# Patient Record
Sex: Female | Born: 1995 | Race: White | Hispanic: No | Marital: Married | Smoking: Never smoker
Health system: Southern US, Community
[De-identification: ages and names within clinical notes are randomized; demographics above are authoritative.]

## PROBLEM LIST (undated history)

## (undated) DIAGNOSIS — K219 Gastro-esophageal reflux disease without esophagitis: Secondary | ICD-10-CM

## (undated) DIAGNOSIS — G4700 Insomnia, unspecified: Secondary | ICD-10-CM

## (undated) DIAGNOSIS — E282 Polycystic ovarian syndrome: Secondary | ICD-10-CM

## (undated) DIAGNOSIS — F419 Anxiety disorder, unspecified: Secondary | ICD-10-CM

## (undated) DIAGNOSIS — N760 Acute vaginitis: Secondary | ICD-10-CM

## (undated) DIAGNOSIS — F32A Depression, unspecified: Secondary | ICD-10-CM

## (undated) DIAGNOSIS — B9689 Other specified bacterial agents as the cause of diseases classified elsewhere: Secondary | ICD-10-CM

## (undated) DIAGNOSIS — E785 Hyperlipidemia, unspecified: Secondary | ICD-10-CM

## (undated) DIAGNOSIS — R519 Headache, unspecified: Secondary | ICD-10-CM

## (undated) DIAGNOSIS — F329 Major depressive disorder, single episode, unspecified: Secondary | ICD-10-CM

## (undated) DIAGNOSIS — J302 Other seasonal allergic rhinitis: Secondary | ICD-10-CM

## (undated) DIAGNOSIS — L709 Acne, unspecified: Secondary | ICD-10-CM

## (undated) HISTORY — DX: Acute vaginitis: B96.89

## (undated) HISTORY — PX: CHOLECYSTECTOMY: SHX55

## (undated) HISTORY — DX: Insomnia, unspecified: G47.00

## (undated) HISTORY — DX: Acute vaginitis: N76.0

## (undated) HISTORY — DX: Major depressive disorder, single episode, unspecified: F32.9

## (undated) HISTORY — DX: Acne, unspecified: L70.9

## (undated) HISTORY — DX: Polycystic ovarian syndrome: E28.2

## (undated) HISTORY — DX: Other seasonal allergic rhinitis: J30.2

## (undated) HISTORY — DX: Depression, unspecified: F32.A

---
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
## 2013-06-05 ENCOUNTER — Emergency Department (HOSPITAL_COMMUNITY)
Admission: EM | Admit: 2013-06-05 | Discharge: 2013-06-05 | Disposition: A | Discharge status: Home / Self Care | Payer: No Typology Code available for payment source | Attending: Emergency Medicine | Admitting: Emergency Medicine

## 2013-06-05 ENCOUNTER — Emergency Department (HOSPITAL_COMMUNITY): Payer: No Typology Code available for payment source

## 2013-06-05 ENCOUNTER — Encounter (HOSPITAL_COMMUNITY): Payer: Self-pay | Admitting: Emergency Medicine

## 2013-06-05 DIAGNOSIS — Y92410 Unspecified street and highway as the place of occurrence of the external cause: Secondary | ICD-10-CM | POA: Insufficient documentation

## 2013-06-05 DIAGNOSIS — S39012A Strain of muscle, fascia and tendon of lower back, initial encounter: Secondary | ICD-10-CM

## 2013-06-05 DIAGNOSIS — Y9389 Activity, other specified: Secondary | ICD-10-CM | POA: Insufficient documentation

## 2013-06-05 DIAGNOSIS — S239XXA Sprain of unspecified parts of thorax, initial encounter: Secondary | ICD-10-CM | POA: Insufficient documentation

## 2013-06-05 DIAGNOSIS — Z8781 Personal history of (healed) traumatic fracture: Secondary | ICD-10-CM | POA: Insufficient documentation

## 2013-06-05 MED ORDER — NAPROXEN 500 MG PO TABS: 500.0000 mg | ORAL_TABLET | Freq: Two times a day (BID) | ORAL | Status: DC

## 2013-06-05 MED ORDER — METHOCARBAMOL 500 MG PO TABS: 500.0000 mg | ORAL_TABLET | Freq: Three times a day (TID) | ORAL | Status: DC | PRN
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
## 2013-06-05 NOTE — Discharge Instructions (Signed)

## 2015-11-14 ENCOUNTER — Ambulatory Visit: Payer: Self-pay | Admitting: Family Medicine

## 2015-11-21 ENCOUNTER — Ambulatory Visit (INDEPENDENT_AMBULATORY_CARE_PROVIDER_SITE_OTHER): Payer: BLUE CROSS/BLUE SHIELD | Admitting: Family Medicine

## 2015-11-21 ENCOUNTER — Encounter: Payer: Self-pay | Admitting: Family Medicine

## 2015-11-21 DIAGNOSIS — Z0000 Encounter for general adult medical examination without abnormal findings: Secondary | ICD-10-CM | POA: Diagnosis not present

## 2015-11-21 NOTE — Patient Instructions (Signed)
Health Maintenance, Female Adopting a healthy lifestyle and getting preventive care can go a long way to promote health and wellness. Talk with your health care provider about what schedule of regular examinations is right for you. This is a good chance for you to check in with your provider about disease prevention and staying healthy. In between checkups, there are plenty of things you can do on your own. Experts have done a lot of research about which lifestyle changes and preventive measures are most likely to keep you healthy. Ask your health care provider for more information. WEIGHT AND DIET  Eat a healthy diet  Be sure to include plenty of vegetables, fruits, low-fat dairy products, and lean protein.  Do not eat a lot of foods high in solid fats, added sugars, or salt.  Get regular exercise. This is one of the most important things you can do for your health.  Most adults should exercise for at least 150 minutes each week. The exercise should increase your heart rate and make you sweat (moderate-intensity exercise).  Most adults should also do strengthening exercises at least twice a week. This is in addition to the moderate-intensity exercise.  Maintain a healthy weight  Body mass index (BMI) is a measurement that can be used to identify possible weight problems. It estimates body fat based on height and weight. Your health care provider can help determine your BMI and help you achieve or maintain a healthy weight.  For females 20 years of age and older:   A BMI below 18.5 is considered underweight.  A BMI of 18.5 to 24.9 is normal.  A BMI of 25 to 29.9 is considered overweight.  A BMI of 30 and above is considered obese.  Watch levels of cholesterol and blood lipids  You should start having your blood tested for lipids and cholesterol at 22 years of age, then have this test every 5 years.  You may need to have your cholesterol levels checked more often if:  Your lipid  or cholesterol levels are high.  You are older than 22 years of age.  You are at high risk for heart disease.  CANCER SCREENING   Lung Cancer  Lung cancer screening is recommended for adults 55-80 years old who are at high risk for lung cancer because of a history of smoking.  A yearly low-dose CT scan of the lungs is recommended for people who:  Currently smoke.  Have quit within the past 15 years.  Have at least a 30-pack-year history of smoking. A pack year is smoking an average of one pack of cigarettes a day for 1 year.  Yearly screening should continue until it has been 15 years since you quit.  Yearly screening should stop if you develop a health problem that would prevent you from having lung cancer treatment.  Breast Cancer  Practice breast self-awareness. This means understanding how your breasts normally appear and feel.  It also means doing regular breast self-exams. Let your health care provider know about any changes, no matter how small.  If you are in your 20s or 30s, you should have a clinical breast exam (CBE) by a health care provider every 1-3 years as part of a regular health exam.  If you are 40 or older, have a CBE every year. Also consider having a breast X-ray (mammogram) every year.  If you have a family history of breast cancer, talk to your health care provider about genetic screening.  If you   are at high risk for breast cancer, talk to your health care provider about having an MRI and a mammogram every year.  Breast cancer gene (BRCA) assessment is recommended for women who have family members with BRCA-related cancers. BRCA-related cancers include:  Breast.  Ovarian.  Tubal.  Peritoneal cancers.  Results of the assessment will determine the need for genetic counseling and BRCA1 and BRCA2 testing. Cervical Cancer Your health care provider may recommend that you be screened regularly for cancer of the pelvic organs (ovaries, uterus, and  vagina). This screening involves a pelvic examination, including checking for microscopic changes to the surface of your cervix (Pap test). You may be encouraged to have this screening done every 3 years, beginning at age 21.  For women ages 30-65, health care providers may recommend pelvic exams and Pap testing every 3 years, or they may recommend the Pap and pelvic exam, combined with testing for human papilloma virus (HPV), every 5 years. Some types of HPV increase your risk of cervical cancer. Testing for HPV may also be done on women of any age with unclear Pap test results.  Other health care providers may not recommend any screening for nonpregnant women who are considered low risk for pelvic cancer and who do not have symptoms. Ask your health care provider if a screening pelvic exam is right for you.  If you have had past treatment for cervical cancer or a condition that could lead to cancer, you need Pap tests and screening for cancer for at least 20 years after your treatment. If Pap tests have been discontinued, your risk factors (such as having a new sexual partner) need to be reassessed to determine if screening should resume. Some women have medical problems that increase the chance of getting cervical cancer. In these cases, your health care provider may recommend more frequent screening and Pap tests. Colorectal Cancer  This type of cancer can be detected and often prevented.  Routine colorectal cancer screening usually begins at 22 years of age and continues through 22 years of age.  Your health care provider may recommend screening at an earlier age if you have risk factors for colon cancer.  Your health care provider may also recommend using home test kits to check for hidden blood in the stool.  A small camera at the end of a tube can be used to examine your colon directly (sigmoidoscopy or colonoscopy). This is done to check for the earliest forms of colorectal  cancer.  Routine screening usually begins at age 50.  Direct examination of the colon should be repeated every 5-10 years through 22 years of age. However, you may need to be screened more often if early forms of precancerous polyps or small growths are found. Skin Cancer  Check your skin from head to toe regularly.  Tell your health care provider about any new moles or changes in moles, especially if there is a change in a mole's shape or color.  Also tell your health care provider if you have a mole that is larger than the size of a pencil eraser.  Always use sunscreen. Apply sunscreen liberally and repeatedly throughout the day.  Protect yourself by wearing long sleeves, pants, a wide-brimmed hat, and sunglasses whenever you are outside. HEART DISEASE, DIABETES, AND HIGH BLOOD PRESSURE   High blood pressure causes heart disease and increases the risk of stroke. High blood pressure is more likely to develop in:  People who have blood pressure in the high end   of the normal range (130-139/85-89 mm Hg).  People who are overweight or obese.  People who are African American.  If you are 38-23 years of age, have your blood pressure checked every 3-5 years. If you are 61 years of age or older, have your blood pressure checked every year. You should have your blood pressure measured twice--once when you are at a hospital or clinic, and once when you are not at a hospital or clinic. Record the average of the two measurements. To check your blood pressure when you are not at a hospital or clinic, you can use:  An automated blood pressure machine at a pharmacy.  A home blood pressure monitor.  If you are between 45 years and 39 years old, ask your health care provider if you should take aspirin to prevent strokes.  Have regular diabetes screenings. This involves taking a blood sample to check your fasting blood sugar level.  If you are at a normal weight and have a low risk for diabetes,  have this test once every three years after 22 years of age.  If you are overweight and have a high risk for diabetes, consider being tested at a younger age or more often. PREVENTING INFECTION  Hepatitis B  If you have a higher risk for hepatitis B, you should be screened for this virus. You are considered at high risk for hepatitis B if:  You were born in a country where hepatitis B is common. Ask your health care provider which countries are considered high risk.  Your parents were born in a high-risk country, and you have not been immunized against hepatitis B (hepatitis B vaccine).  You have HIV or AIDS.  You use needles to inject street drugs.  You live with someone who has hepatitis B.  You have had sex with someone who has hepatitis B.  You get hemodialysis treatment.  You take certain medicines for conditions, including cancer, organ transplantation, and autoimmune conditions. Hepatitis C  Blood testing is recommended for:  Everyone born from [birth year] through 1967.  Anyone with known risk factors for hepatitis C. Sexually transmitted infections (STIs)  You should be screened for sexually transmitted infections (STIs) including gonorrhea and chlamydia if:  You are sexually active and are younger than 22 years of age.  You are older than 22 years of age and your health care provider tells you that you are at risk for this type of infection.  Your sexual activity has changed since you were last screened and you are at an increased risk for chlamydia or gonorrhea. Ask your health care provider if you are at risk.  If you do not have HIV, but are at risk, it may be recommended that you take a prescription medicine daily to prevent HIV infection. This is called pre-exposure prophylaxis (PrEP). You are considered at risk if:  You are sexually active and do not regularly use condoms or know the HIV status of your partner(s).  You take drugs by injection.  You are sexually  active with a partner who has HIV. Talk with your health care provider about whether you are at high risk of being infected with HIV. If you choose to begin PrEP, you should first be tested for HIV. You should then be tested every 3 months for as long as you are taking PrEP.  PREGNANCY   If you are premenopausal and you may become pregnant, ask your health care provider about preconception counseling.  If you may  become pregnant, take 400 to 800 micrograms (mcg) of folic acid every day.  If you want to prevent pregnancy, talk to your health care provider about birth control (contraception). OSTEOPOROSIS AND MENOPAUSE   Osteoporosis is a disease in which the bones lose minerals and strength with aging. This can result in serious bone fractures. Your risk for osteoporosis can be identified using a bone density scan.  If you are 61 years of age or older, or if you are at risk for osteoporosis and fractures, ask your health care provider if you should be screened.  Ask your health care provider whether you should take a calcium or vitamin D supplement to lower your risk for osteoporosis.  Menopause may have certain physical symptoms and risks.  Hormone replacement therapy may reduce some of these symptoms and risks. Talk to your health care provider about whether hormone replacement therapy is right for you.  HOME CARE INSTRUCTIONS   Schedule regular health, dental, and eye exams.  Stay current with your immunizations.   Do not use any tobacco products including cigarettes, chewing tobacco, or electronic cigarettes.  If you are pregnant, do not drink alcohol.  If you are breastfeeding, limit how much and how often you drink alcohol.  Limit alcohol intake to no more than 1 drink per day for nonpregnant women. One drink equals 12 ounces of beer, 5 ounces of wine, or 1 ounces of hard liquor.  Do not use street drugs.  Do not share needles.  Ask your health care provider for help if  you need support or information about quitting drugs.  Tell your health care provider if you often feel depressed.  Tell your health care provider if you have ever been abused or do not feel safe at home.   This information is not intended to replace advice given to you by your health care provider. Make sure you discuss any questions you have with your health care provider.   Document Released: 10/07/2010 Document Revised: 04/14/2014 Document Reviewed: 02/23/2013 Elsevier Interactive Patient Education [employer].

## 2015-11-21 NOTE — Assessment & Plan Note (Signed)
Pap smear up-to-date. Tetanus up-to-date. Flu shot later this year. Declines labs. Using NuvaRing for contraception. 1 partner.
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
## 2015-11-21 NOTE — Progress Notes (Signed)
Pre visit review using our clinic review tool, if applicable. No additional management support is needed unless otherwise documented below in the visit note. 

## 2015-12-18 ENCOUNTER — Ambulatory Visit (INDEPENDENT_AMBULATORY_CARE_PROVIDER_SITE_OTHER): Payer: BC Managed Care – PPO | Admitting: Family Medicine

## 2015-12-18 DIAGNOSIS — M546 Pain in thoracic spine: Secondary | ICD-10-CM | POA: Diagnosis not present

## 2015-12-18 NOTE — Progress Notes (Signed)
   Subjective:  Patient ID: Olivia Gillespie, female    DOB: 23-Nov-1995  Age: 20 y.o. MRN: [id number]  CC: Back pain  HPI:  20 year old female presents with the above complaint.  Patient states that she has been experiencing upper back pain for the past few days (since Thursday). Patient states that the pain is located in the middle of her upper back between the scapula. She feels like she may have injured the area while at work. She's been using topical BenGay with improvement. No known exacerbating factors. No other associated symptoms. No other complaints at this time.  Social Hx   Social History   Social History  . Marital status: Single    Spouse name: N/A  . Number of children: N/A  . Years of education: N/A   Social History Main Topics  . Smoking status: Never Smoker  . Smokeless tobacco: Never Used  . Alcohol use No  . Drug use: No  . Sexual activity: Yes   Other Topics Concern  . Not on file   Social History Narrative  . No narrative on file    Review of Systems  Constitutional: Negative.   Musculoskeletal: Positive for back pain.   Objective:  BP 120/84 (BP Location: Left Arm)   Pulse 99   Temp 98.7 F (37.1 C) (Oral)   Wt 159 lb 6.4 oz (72.3 kg)   SpO2 99%   BMI 28.24 kg/m   BP/Weight 12/18/2015 11/21/2015 06/05/2013  Systolic BP 120 119 134  Diastolic BP 84 83 84  Wt. (Lbs) 159.4 153.5 -  BMI 28.24 27.19 -   Physical Exam  Constitutional: She is oriented to person, place, and time. She appears well-developed. No distress.  Pulmonary/Chest: Effort normal.  Musculoskeletal:  Upper back - nontender to palpation. No evidence of spasm.  Neurological: She is alert and oriented to person, place, and time.  Psychiatric: She has a normal mood and affect.  Vitals reviewed.   Assessment & Plan:   Problem List Items Addressed This Visit    Thoracic back pain    New problem.  MSK in etiology. No indication for imaging. Advised Ibuprofen 800 mg TID PRN. OTC  Biofreeze or Bengay if desired.       Other Visit Diagnoses   None.     Follow-up: PRN  Everlene OtherJayce Nikolai Wilczak DO [facility] [place]

## 2015-12-18 NOTE — Patient Instructions (Signed)
This appears to be muscular in nature.  Bengay and Biofreeze is fine.  Use Ibuprofen 800 mg three times daily as needed.  Follow up annually. Sooner if you worsen.  Take care  Dr. Adriana Simasook

## 2015-12-18 NOTE — Assessment & Plan Note (Signed)
New problem.  MSK in etiology. No indication for imaging. Advised Ibuprofen 800 mg TID PRN. OTC Biofreeze or Bengay if desired.

## 2016-03-27 ENCOUNTER — Ambulatory Visit (INDEPENDENT_AMBULATORY_CARE_PROVIDER_SITE_OTHER): Payer: BC Managed Care – PPO | Admitting: Certified Nurse Midwife

## 2016-03-27 ENCOUNTER — Encounter: Payer: Self-pay | Admitting: Certified Nurse Midwife

## 2016-03-27 VITALS — BP 124/82 | HR 89 | Ht 66.0 in | Wt 157.4 lb

## 2016-03-27 DIAGNOSIS — N760 Acute vaginitis: Secondary | ICD-10-CM | POA: Diagnosis not present

## 2016-03-27 DIAGNOSIS — N898 Other specified noninflammatory disorders of vagina: Secondary | ICD-10-CM | POA: Diagnosis not present

## 2016-03-27 NOTE — Progress Notes (Signed)
CHIEF COMPLAINT/HPI:  20 y.o. female complains of clear, odorless, scant vaginal discharge with vulvar erythema for two week(s). Pt has history of frequent BV and has successfully used Metrogel in the past to treat symptoms. Pt used refill of Metrogel last week and is feeling better, but decided to keep today's appointment since it was already scheduled.   Denies abnormal vaginal bleeding, significant pelvic pain or fever. Sexually active, use condoms, no change in partner.  Last intercourse 2 days ago.  Denies history of known exposure to STD or symptoms in partner.  Patient's last menstrual period was 03/09/2016.  No history of STD's.  Review of Systems  Constitutional: Negative for fever and chills Eyes: Negative for visual disturbances Respiratory: Negative for shortness of breath, dyspnea Cardiovascular: Negative for chest pain or palpitations  Gastrointestinal: Negative for vomiting, diarrhea and constipation Genitourinary: burning with urination Musculoskeletal: Negative for back pain, joint pain, myalgias  Neurological: Negative for dizziness and headaches    Past Medical History: Past Medical History:  Diagnosis Date  . Bacterial vaginitis   . Depression     Past Surgical History: Past Surgical History:  Procedure Laterality Date  . WISDOM TOOTH EXTRACTION  2013    Obstetrical History: OB History    Gravida Para Term Preterm AB Living   0 0 0 0 0 0   SAB TAB Ectopic Multiple Live Births   0 0 0 0 0       Social History: Social History   Social History  . Marital status: Single    Spouse name: N/A  . Number of children: N/A  . Years of education: N/A   Social History Main Topics  . Smoking status: Never Smoker  . Smokeless tobacco: Never Used  . Alcohol use No  . Drug use: No  . Sexual activity: Yes    Birth control/ protection: Inserts   Other Topics Concern  . None   Social History Narrative  . None    Family History: Family History  Problem  Relation Age of Onset  . Hypertension Mother   . Mental illness Maternal Grandmother   . Hyperlipidemia Paternal Grandfather   . Diabetes Paternal Grandfather     Allergies: No Known Allergies   Objective:   BP 124/82   Pulse 89   Ht 5\' 6"  (1.676 m)   Wt 157 lb 6.4 oz (71.4 kg)   LMP 03/09/2016   BMI 25.41 kg/m  CONSTITUTIONAL: Well-developed, female in no acute distress.  HENT:  Normocephalic, atraumatic.  NECK: Not Examined SKIN: Skin is warm and dry. No rash noted. Not diaphoretic. No erythema. No pallor. NEUROLGIC: Alert and oriented to person, place, and time.  PSYCHIATRIC: Normal mood and affect. Normal behavior. Normal judgment and thought content. CARDIOVASCULAR:Not Examined RESPIRATORY: Not Examined BREASTS: Not Examined ABDOMEN: Not Examined PELVIC:  External Genitalia: Normal  BUS: Normal  Vagina: Normal  Cervix: Normal, no cervical motion tenderness MUSCULOSKELETAL: Normal range of motion. No tenderness.  No cyanosis, clubbing, or edema.  Urine dip: Negative Wet prep: Negative   Assessment:   Vaginal discharge Vaginal irriration   Plan:   Continue to use Metrogel as needed.  GC/Ch from urine per patient request.  RTC in May for AE or sooner if needed.   Gunnar BullaJenkins Michelle Emila Steinhauser, CNM

## 2016-03-29 LAB — GC/CHLAMYDIA PROBE AMP
Chlamydia trachomatis, NAA: NEGATIVE
Neisseria gonorrhoeae by PCR: NEGATIVE

## 2016-04-28 ENCOUNTER — Encounter: Payer: Self-pay | Admitting: *Deleted

## 2016-05-06 ENCOUNTER — Ambulatory Visit (INDEPENDENT_AMBULATORY_CARE_PROVIDER_SITE_OTHER): Payer: BC Managed Care – PPO | Admitting: Family Medicine

## 2016-05-06 DIAGNOSIS — B349 Viral infection, unspecified: Secondary | ICD-10-CM

## 2016-05-06 NOTE — Progress Notes (Signed)
    Subjective:  Patient ID: Olivia Gillespie, female    DOB: [birth year]  Age: 21 y.o. MRN: [id number]  CC: Concern for influenza  HPI:  21 year old female presents with concerns for influenza.  Patient states that last night she developed nausea, chest tightness/trouble getting a deep breath, fatigue, dizziness, congestion, runny nose. No fever. No chills. No body aches. She has had exposure to an individual with the flu at work. This is her primary concern today. No medications or interventions tried. No known exacerbating or relieving factors. No other complaints or concerns at this time.  Social Hx   Social History   Social History  . Marital status: Single    Spouse name: N/A  . Number of children: N/A  . Years of education: N/A   Social History Main Topics  . Smoking status: Never Smoker  . Smokeless tobacco: Never Used  . Alcohol use No  . Drug use: No  . Sexual activity: Yes    Birth control/ protection: Inserts   Other Topics Concern  . Not on file   Social History Narrative  . No narrative on file   Review of Systems  Constitutional: Positive for fatigue. Negative for chills and fever.  HENT: Positive for congestion and rhinorrhea.   Respiratory: Positive for chest tightness.   Gastrointestinal: Positive for nausea.  Musculoskeletal:       No body aches.  Neurological: Positive for dizziness.   Objective:  BP 119/85   Temp 98.4 F (36.9 C) (Oral)   Wt 154 lb 9.6 oz (70.1 kg)   SpO2 98%   BMI 24.95 kg/m   BP/Weight 05/06/2016 03/27/2016 12/18/2015  Systolic BP 119 124 120  Diastolic BP 85 82 84  Wt. (Lbs) 154.6 157.4 159.4  BMI 24.95 25.41 28.24    Physical Exam  Constitutional: She is oriented to person, place, and time. She appears well-developed. No distress.  HENT:  Head: Normocephalic and atraumatic.  Mouth/Throat: Oropharynx is clear and moist.  Normal TMs bilaterally.  Cardiovascular: Regular rhythm.   Tachycardia.  Pulmonary/Chest: Effort  normal and breath sounds normal. She has no wheezes. She has no rales.  Abdominal: Soft. She exhibits no distension. There is no tenderness. There is no rebound and no guarding.  Neurological: She is alert and oriented to person, place, and time.  Vitals reviewed.   Assessment & Plan:   Problem List Items Addressed This Visit    Viral illness    New acute problem. Acute uncomplicated illness. Based on history of physical exam, does not appear to be the flu. She was not swab for influenza as we are running out of tests and they are on back order (given the current flu crisis). She has had flu exposure. I doubt that she has the flu given her clinical picture and lack of fever, chills, body aches. Advised supportive care and over-the-counter medication as needed for her symptoms.        Follow-up: PRN  Everlene OtherJayce Tashe Purdon DO [facility] [place]

## 2016-05-06 NOTE — Assessment & Plan Note (Signed)
New acute problem. Acute uncomplicated illness. Based on history of physical exam, does not appear to be the flu. She was not swab for influenza as we are running out of tests and they are on back order (given the current flu crisis). She has had flu exposure. I doubt that she has the flu given her clinical picture and lack of fever, chills, body aches. Advised supportive care and over-the-counter medication as needed for her symptoms.

## 2016-05-06 NOTE — Patient Instructions (Signed)
This appears viral (I doubt the flu).  If you worsen, please call.  Use OTC medication for your symptoms if you desire.  Take care  Dr. Adriana Simasook

## 2016-05-07 ENCOUNTER — Other Ambulatory Visit: Payer: Self-pay | Admitting: Family Medicine

## 2016-05-07 ENCOUNTER — Telehealth: Payer: Self-pay | Admitting: Family Medicine

## 2016-05-07 MED ORDER — OMEPRAZOLE 40 MG PO CPDR: 40.0000 mg | DELAYED_RELEASE_CAPSULE | Freq: Every day | ORAL | 0 refills | Status: DC

## 2016-05-07 NOTE — Telephone Encounter (Signed)
Will send in Rx.

## 2016-05-07 NOTE — Telephone Encounter (Signed)
Patient notified and voiced understanding.

## 2016-05-07 NOTE — Telephone Encounter (Signed)
Pt called and stated that she came in yesterday and some of her symptoms are getting worse. She states that she is having chest tightness that feels like heartburn, fatigue,dizziness, and bloating. Please advise, thank you!  Call pt @ [phone]

## 2016-05-07 NOTE — Telephone Encounter (Signed)
Patient saw PCP on 05/06/16 for viral infection, patient called today with worsening symptoms of fatigue, chest tightness, dizziness, and heartburn with bloating.Patient stated that she cannot sleep due the chest tightness, with heartburn, and a lot of gas has tried elevation, patient is taking Tums for the heartburn which helps for about 2 hours. Patient is still a febrile. Patient really wants to know if there is something she can take that is stronger than Tums.

## 2016-05-09 NOTE — Telephone Encounter (Signed)
Pt called back and states that the medication that Dr. Adriana Simasook has prescribed has give her really bad headaches. Pt is not sure if this is a side effect of the medication. Please advise, thank you!  Call pt @ [phone]

## 2016-05-09 NOTE — Telephone Encounter (Signed)
Ibuprofen as needed. 800 mg TID PRN.

## 2016-05-09 NOTE — Telephone Encounter (Signed)
Reason for call:left frontal headache  Symptoms:headache pain all the way to back of head started Wednesday night  Medications:omeprazole, Tylenol didn't relieve pain Last seen for this problem:05/06/2016 Omeprazole helped stomach ulcer  Please advise

## 2016-05-09 NOTE — Telephone Encounter (Signed)
Patient advised of below and verbalized understanding.  

## 2016-06-11 ENCOUNTER — Telehealth: Payer: Self-pay | Admitting: Certified Nurse Midwife

## 2016-06-11 DIAGNOSIS — Z789 Other specified health status: Secondary | ICD-10-CM

## 2016-06-11 MED ORDER — ULIPRISTAL ACETATE 30 MG PO TABS: 1.0000 | ORAL_TABLET | Freq: Once | ORAL | 0 refills | Status: AC

## 2016-06-11 NOTE — Telephone Encounter (Signed)
Called pt she states that she would like RX for "Samson Fredericlla" a form of emergency contraception that her insurance will cover. Advised pt that RX would be sent in if provider approved, advised pt that this is not a form of contraception and needs to be compliant with Nuvaring. RX sent in after speaking with M. Jeralyn BennettLawhorn.

## 2016-06-11 NOTE — Telephone Encounter (Signed)
Pt called asking if we could send something to the pharmacy for emergency contraceptive.  [facility]  Thank sTeri

## 2016-07-01 ENCOUNTER — Telehealth: Payer: Self-pay | Admitting: Family Medicine

## 2016-07-01 NOTE — Telephone Encounter (Signed)
Pt states that her mom was diagnosed with hormonal issues and wants to make sure she does not have it or if she does to have the endocrinologist check it.

## 2016-07-01 NOTE — Telephone Encounter (Signed)
What is she referring to. I need more info.

## 2016-07-01 NOTE — Telephone Encounter (Signed)
Left message to call.

## 2016-07-01 NOTE — Telephone Encounter (Signed)
Pt lvm asking for a referral to endocrinology. States that her mother has a hormonal disorder and she thinks she does as well. Please advise.

## 2016-07-02 NOTE — Telephone Encounter (Signed)
Appointment scheduled.

## 2016-07-02 NOTE — Telephone Encounter (Signed)
She should not need a referral to GYN as this is primary care.

## 2016-07-02 NOTE — Telephone Encounter (Signed)
Patient calls back stating mom was diagnosed with Polycystic ovarian syndrome would like referral to OB/GYN.

## 2016-07-10 ENCOUNTER — Encounter: Payer: Self-pay | Admitting: Certified Nurse Midwife

## 2016-07-10 ENCOUNTER — Ambulatory Visit (INDEPENDENT_AMBULATORY_CARE_PROVIDER_SITE_OTHER): Payer: BC Managed Care – PPO | Admitting: Certified Nurse Midwife

## 2016-07-10 VITALS — BP 116/88 | HR 96 | Ht 65.0 in | Wt 155.8 lb

## 2016-07-10 DIAGNOSIS — L680 Hirsutism: Secondary | ICD-10-CM

## 2016-07-10 DIAGNOSIS — L709 Acne, unspecified: Secondary | ICD-10-CM

## 2016-07-10 DIAGNOSIS — Z842 Family history of other diseases of the genitourinary system: Secondary | ICD-10-CM

## 2016-07-10 DIAGNOSIS — Z3044 Encounter for surveillance of vaginal ring hormonal contraceptive device: Secondary | ICD-10-CM | POA: Diagnosis not present

## 2016-07-10 MED ORDER — ETONOGESTREL-ETHINYL ESTRADIOL 0.12-0.015 MG/24HR VA RING: VAGINAL_RING | VAGINAL | 12 refills | Status: DC

## 2016-07-10 NOTE — Progress Notes (Signed)
GYN ENCOUNTER NOTE  Subjective:       Olivia Gillespie is a 21 y.o. G0P0000 female here for birth control refill.   Olivia Gillespie currently uses the NuvaRing and likes it.   Her mother was diagnosed with PCOS and recently Olivia Gillespie started to notice similar symptoms in herself.  She reports thinning hair, acne to face and body, and excessive body hair that requires weekly home waxing.   Last year, she discussed her mother's diagnoses with her previous provider and requested an official "work up for PCOS", but that request was denied.   Olivia Gillespie requests labs and possibly an ultrasound "rule out or in" PCOS.   Denies difficulty breathing or respiratory distress, chest pain, abdominal pain, unexplained vaginal bleeding, and leg pain or swelling.    Gynecologic History  Patient's last menstrual period was 04/20/2016.   Contraception: NuvaRing vaginal inserts   Last Pap: 2016. Results were: normal  Obstetric History OB History  Gravida Para Term Preterm AB Living  0 0 0 0 0 0  SAB TAB Ectopic Multiple Live Births  0 0 0 0 0        Past Medical History:  Diagnosis Date  . Bacterial vaginitis   . Depression     Past Surgical History:  Procedure Laterality Date  . WISDOM TOOTH EXTRACTION  2013    Current Outpatient Prescriptions on File Prior to Visit  Medication Sig Dispense Refill  . Melatonin 3 MG CAPS Take 3 mg by mouth at bedtime.    Marland Kitchen omeprazole (PRILOSEC) 40 MG capsule Take 1 capsule (40 mg total) by mouth daily. 30 capsule 0   No current facility-administered medications on file prior to visit.     No Known Allergies  Social History   Social History  . Marital status: Single    Spouse name: N/A  . Number of children: N/A  . Years of education: N/A   Occupational History  . Not on file.   Social History Main Topics  . Smoking status: Never Smoker  . Smokeless tobacco: Never Used  . Alcohol use No  . Drug use: No  . Sexual activity: Yes    Birth control/ protection:  Inserts   Other Topics Concern  . Not on file   Social History Narrative  . No narrative on file    Family History  Problem Relation Age of Onset  . Hypertension Mother   . Mental illness Maternal Grandmother   . Hyperlipidemia Paternal Grandfather   . Diabetes Paternal Grandfather     The following portions of the patient's history were reviewed and updated as appropriate: allergies, current medications, past family history, past medical history, past social history, past surgical history and problem list.  Review of Systems  Review of Systems - Negative except as noted above History obtained from the patient  Objective:   BP 116/88   Pulse 96   Ht  (1.651 m)   Wt 155 lb 12.8 oz (70.7 kg)   LMP 04/20/2016 Comment: nuvaring (may skip periods)  BMI 25.93 kg/m   Alert and oriented x 4, no apparent distress  Physical exam: Not indicated.   Assessment:   1. Hirsutism  2. Acne, unspecified acne type  3. Family history of polycystic ovarian syndrome  4. Encounter for surveillance of vaginal ring hormonal contraceptive device  Plan:   NuvaRing Rx, see orders. Risks and benefits reviewed including ACHES acroymn  RTC x 1 week for labs and ultrasound, see orders  RTC x  2 weeks to discuss results   Gunnar Bulla, CNM

## 2016-07-10 NOTE — Patient Instructions (Signed)
Diet for Polycystic Ovarian Syndrome Polycystic ovary syndrome (PCOS) is a disorder of the chemical messengers (hormones) that regulate menstruation. The condition causes important hormones to be out of balance. PCOS can:  Make your periods irregular or stop.  Cause cysts to develop on the ovaries.  Make it difficult to get pregnant.  Stop your body from responding to the effects of insulin (insulin resistance), which can lead to obesity and diabetes. Changing what you eat can help manage PCOS and improve your health. It can help you lose weight and improve the way your body uses insulin. What is my plan?  Eat breakfast, lunch, and dinner plus two snacks every day.  Include protein in each meal and snack.  Choose whole grains instead of products made with refined flour.  Eat a variety of foods.  Exercise regularly as told by your health care provider. What do I need to know about this eating plan? If you are overweight or obese, pay attention to how many calories you eat. Cutting down on calories can help you lose weight. Work with your health care provider or dietitian to figure out how many calories you need each day. What foods can I eat? Grains  Whole grains, such as whole wheat. Whole-grain breads, crackers, cereals, and pasta. Unsweetened oatmeal, bulgur, barley, quinoa, or brown rice. Corn or whole-wheat flour tortillas. Vegetables   Lettuce. Spinach. Peas. Beets. Cauliflower. Cabbage. Broccoli. Carrots. Tomatoes. Squash. Eggplant. Herbs. Peppers. Onions. Cucumbers. Brussels sprouts. Fruits  Berries. Bananas. Apples. Oranges. Grapes. Papaya. Mango. Pomegranate. Kiwi. Grapefruit. Cherries. Meats and Other Protein Sources  Lean proteins, such as fish, chicken, beans, eggs, and tofu. Dairy  Low-fat dairy products, such as skim milk, cheese sticks, and yogurt. Beverages  Low-fat or fat-free drinks, such as water, low-fat milk, sugar-free drinks, and 100% fruit  juice. Condiments  Ketchup. Mustard. Barbecue sauce. Relish. Low-fat or fat-free mayonnaise. Fats and Oils  Olive oil or canola oil. Walnuts and almonds. The items listed above may not be a complete list of recommended foods or beverages. Contact your dietitian for more options.  What foods are not recommended? Foods high in calories or fat. Fried foods. Sweets. Products made from refined white flour, including white bread, pastries, white rice, and pasta. The items listed above may not be a complete list of foods and beverages to avoid. Contact your dietitian for more information.  This information is not intended to replace advice given to you by your health care provider. Make sure you discuss any questions you have with your health care provider. Document Released: 07/16/2015 Document Revised: 08/30/2015 Document Reviewed: 04/05/2014 Elsevier Interactive Patient Education  2017 [employer].

## 2016-07-11 ENCOUNTER — Other Ambulatory Visit: Payer: BC Managed Care – PPO

## 2016-07-11 ENCOUNTER — Ambulatory Visit: Payer: BC Managed Care – PPO | Admitting: Family Medicine

## 2016-07-11 DIAGNOSIS — Z842 Family history of other diseases of the genitourinary system: Secondary | ICD-10-CM

## 2016-07-11 DIAGNOSIS — L709 Acne, unspecified: Secondary | ICD-10-CM

## 2016-07-11 DIAGNOSIS — L680 Hirsutism: Secondary | ICD-10-CM

## 2016-07-13 LAB — PROGESTERONE: Progesterone: 0.1 ng/mL

## 2016-07-13 LAB — LIPID PANEL
Chol/HDL Ratio: 3.8 ratio (ref 0.0–4.4)
Cholesterol, Total: 234 mg/dL — ABNORMAL HIGH (ref 100–199)
HDL: 61 mg/dL (ref >39)
LDL Calculated: 146 mg/dL — ABNORMAL HIGH (ref 0–99)
Triglycerides: 136 mg/dL (ref 0–149)
VLDL Cholesterol Cal: 27 mg/dL (ref 5–40)

## 2016-07-13 LAB — TESTOSTERONE, FREE, TOTAL, SHBG
Sex Hormone Binding: 206.1 nmol/L — ABNORMAL HIGH (ref 24.6–122.0)
TESTOSTERONE FREE: 0.3 pg/mL (ref 0.0–4.2)

## 2016-07-13 LAB — COMPREHENSIVE METABOLIC PANEL
ALK PHOS: 45 IU/L (ref 39–117)
ALT: 14 IU/L (ref 0–32)
AST: 14 IU/L (ref 0–40)
Albumin/Globulin Ratio: 1.5 (ref 1.2–2.2)
Albumin: 4.1 g/dL (ref 3.5–5.5)
BUN/Creatinine Ratio: 14 (ref 9–23)
BUN: 10 mg/dL (ref 6–20)
Bilirubin Total: 0.3 mg/dL (ref 0.0–1.2)
CHLORIDE: 102 mmol/L (ref 96–106)
CO2: 21 mmol/L (ref 18–29)
Calcium: 9.4 mg/dL (ref 8.7–10.2)
Creatinine, Ser: 0.7 mg/dL (ref 0.57–1.00)
GFR calc Af Amer: 141 mL/min/{1.73_m2} (ref >59)
GFR calc non Af Amer: 123 mL/min/{1.73_m2} (ref >59)
Globulin, Total: 2.8 g/dL (ref 1.5–4.5)
Glucose: 81 mg/dL (ref 65–99)
Potassium: 4.5 mmol/L (ref 3.5–5.2)
Sodium: 140 mmol/L (ref 134–144)
Total Protein: 6.9 g/dL (ref 6.0–8.5)

## 2016-07-13 LAB — PROLACTIN: Prolactin: 10 ng/mL (ref 4.8–23.3)

## 2016-07-13 LAB — FSH/LH

## 2016-07-13 LAB — INSULIN, RANDOM: INSULIN: 11.2 u[IU]/mL (ref 2.6–24.9)

## 2016-07-13 LAB — DHEA-SULFATE: DHEA-SO4: 106.2 ug/dL — ABNORMAL LOW (ref 110.0–431.7)

## 2016-07-13 LAB — TSH: TSH: 1.43 u[IU]/mL (ref 0.450–4.500)

## 2016-07-14 ENCOUNTER — Ambulatory Visit (INDEPENDENT_AMBULATORY_CARE_PROVIDER_SITE_OTHER): Payer: BC Managed Care – PPO

## 2016-07-14 DIAGNOSIS — L680 Hirsutism: Secondary | ICD-10-CM

## 2016-07-14 DIAGNOSIS — Z842 Family history of other diseases of the genitourinary system: Secondary | ICD-10-CM | POA: Diagnosis not present

## 2016-07-14 DIAGNOSIS — L709 Acne, unspecified: Secondary | ICD-10-CM | POA: Diagnosis not present

## 2016-07-22 ENCOUNTER — Encounter: Payer: Self-pay | Admitting: Certified Nurse Midwife

## 2016-07-22 ENCOUNTER — Ambulatory Visit (INDEPENDENT_AMBULATORY_CARE_PROVIDER_SITE_OTHER): Payer: BC Managed Care – PPO | Admitting: Certified Nurse Midwife

## 2016-07-22 VITALS — BP 120/83 | HR 91 | Ht 66.0 in | Wt 156.2 lb

## 2016-07-22 DIAGNOSIS — E282 Polycystic ovarian syndrome: Secondary | ICD-10-CM

## 2016-07-22 DIAGNOSIS — Z7189 Other specified counseling: Secondary | ICD-10-CM | POA: Diagnosis not present

## 2016-07-22 NOTE — Patient Instructions (Signed)
Diet for Polycystic Ovarian Syndrome Polycystic ovary syndrome (PCOS) is a disorder of the chemical messengers (hormones) that regulate menstruation. The condition causes important hormones to be out of balance. PCOS can:  Make your periods irregular or stop.  Cause cysts to develop on the ovaries.  Make it difficult to get pregnant.  Stop your body from responding to the effects of insulin (insulin resistance), which can lead to obesity and diabetes. Changing what you eat can help manage PCOS and improve your health. It can help you lose weight and improve the way your body uses insulin. What is my plan?  Eat breakfast, lunch, and dinner plus two snacks every day.  Include protein in each meal and snack.  Choose whole grains instead of products made with refined flour.  Eat a variety of foods.  Exercise regularly as told by your health care provider. What do I need to know about this eating plan? If you are overweight or obese, pay attention to how many calories you eat. Cutting down on calories can help you lose weight. Work with your health care provider or dietitian to figure out how many calories you need each day. What foods can I eat? Grains  Whole grains, such as whole wheat. Whole-grain breads, crackers, cereals, and pasta. Unsweetened oatmeal, bulgur, barley, quinoa, or brown rice. Corn or whole-wheat flour tortillas. Vegetables   Lettuce. Spinach. Peas. Beets. Cauliflower. Cabbage. Broccoli. Carrots. Tomatoes. Squash. Eggplant. Herbs. Peppers. Onions. Cucumbers. Brussels sprouts. Fruits  Berries. Bananas. Apples. Oranges. Grapes. Papaya. Mango. Pomegranate. Kiwi. Grapefruit. Cherries. Meats and Other Protein Sources  Lean proteins, such as fish, chicken, beans, eggs, and tofu. Dairy  Low-fat dairy products, such as skim milk, cheese sticks, and yogurt. Beverages  Low-fat or fat-free drinks, such as water, low-fat milk, sugar-free drinks, and 100% fruit  juice. Condiments  Ketchup. Mustard. Barbecue sauce. Relish. Low-fat or fat-free mayonnaise. Fats and Oils  Olive oil or canola oil. Walnuts and almonds. The items listed above may not be a complete list of recommended foods or beverages. Contact your dietitian for more options.  What foods are not recommended? Foods high in calories or fat. Fried foods. Sweets. Products made from refined white flour, including white bread, pastries, white rice, and pasta. The items listed above may not be a complete list of foods and beverages to avoid. Contact your dietitian for more information.  This information is not intended to replace advice given to you by your health care provider. Make sure you discuss any questions you have with your health care provider. Document Released: 07/16/2015 Document Revised: 08/30/2015 Document Reviewed: 04/05/2014 Elsevier Interactive Patient Education  2017 [employer] Inc[employer]  

## 2016-07-31 DIAGNOSIS — E282 Polycystic ovarian syndrome: Secondary | ICD-10-CM | POA: Insufficient documentation

## 2016-07-31 NOTE — Progress Notes (Signed)
GYN ENCOUNTER NOTE  Subjective:       Olivia Gillespie is a 21 y.o. G0P0000 female here for results review.   Last visit, Shakeita requested an "official PCOS work up" due her hirsutism, acne, and family history of PCOS.   Denies difficulty breathing or respiratory distress, chest pain, abdominal pain, vaginal bleeding, dysuria, and leg pain or swelling.   She continues to use the NuvaRing and is happy with it as pregnancy prevention.    Gynecologic History  Patient's last menstrual period was 04/21/2016.  Contraception: NuvaRing vaginal inserts  Last Pap: 2016. Results were: normal  Obstetric History OB History  Gravida Para Term Preterm AB Living  0 0 0 0 0 0  SAB TAB Ectopic Multiple Live Births  0 0 0 0 0        Past Medical History:  Diagnosis Date  . Bacterial vaginitis   . Depression     Past Surgical History:  Procedure Laterality Date  . WISDOM TOOTH EXTRACTION  2013    Current Outpatient Prescriptions on File Prior to Visit  Medication Sig Dispense Refill  . etonogestrel-ethinyl estradiol (NUVARING) 0.12-0.015 MG/24HR vaginal ring Insert vaginally and leave in place for 3 consecutive weeks, then remove for 1 week. 1 each 12  . Melatonin 3 MG CAPS Take 3 mg by mouth at bedtime.    Marland Kitchen omeprazole (PRILOSEC) 40 MG capsule Take 1 capsule (40 mg total) by mouth daily. (Patient not taking: Reported on 07/22/2016) 30 capsule 0   No current facility-administered medications on file prior to visit.     No Known Allergies  Social History   Social History  . Marital status: Single    Spouse name: N/A  . Number of children: N/A  . Years of education: N/A   Occupational History  . Not on file.   Social History Main Topics  . Smoking status: Never Smoker  . Smokeless tobacco: Never Used  . Alcohol use No  . Drug use: No  . Sexual activity: Yes    Birth control/ protection: Inserts   Other Topics Concern  . Not on file   Social History Narrative  . No  narrative on file    Family History  Problem Relation Age of Onset  . Hypertension Mother   . Mental illness Maternal Grandmother   . Hyperlipidemia Paternal Grandfather   . Diabetes Paternal Grandfather     The following portions of the patient's history were reviewed and updated as appropriate: allergies, current medications, past family history, past medical history, past social history, past surgical history and problem list.  Review of Systems  Review of Systems - Negative except as noted above.  History obtained from the patient.  Objective:   BP 120/83   Pulse 91   Ht  (1.676 m)   Wt 156 lb 3.2 oz (70.9 kg)   LMP 04/21/2016   BMI 25.21 kg/m   Alert and oriented x 4, no apparent distress  Physical exam: not indicated  ULTRASOUND REPORT  Location: [facility] Date of Service: 07/14/16   Indications:pcos Findings:  The uterus measures 7.5 x 3.4 x 3.7 cm. Echo texture is homogenous without evidence of focal masses.  The Endometrium measures 6.9 mm.  Right Ovary measures 2 x .7 x 1.5  cm. It is normal in appearance. Left Ovary measures 1.9 x 1.1 x 1.7 cm. It is normal appearance. Survey of the adnexa demonstrates no adnexal masses. There is no free fluid in the  cul de sac.  Impression: 1. WNL  Recommendations: 1.Clinical correlation with the patient's History and Physical Exam.  Recent Results (from the past 2160 hour(s))  DHEA-sulfate     Status: Abnormal   Collection Time: 07/11/16  9:27 AM  Result Value Ref Range   DHEA-SO4 106.2 (L) 110.0 - 431.7 ug/dL  Insulin, random     Status: None   Collection Time: 07/11/16  9:27 AM  Result Value Ref Range   INSULIN 11.2 2.6 - 24.9 uIU/mL  Progesterone     Status: None   Collection Time: 07/11/16  9:27 AM  Result Value Ref Range   Progesterone 0.1 ng/mL    Comment:                      Follicular phase       0.1 -   0.9                      Luteal phase           1.8 -  23.9                       Ovulation phase        0.1 -  12.0                      Pregnant                         First trimester    11.0 -  44.3                         Second trimester   25.4 -  83.3                         Third trimester    58.7 - 214.0                      Postmenopausal         0.0 -   0.1   Prolactin     Status: None   Collection Time: 07/11/16  9:27 AM  Result Value Ref Range   Prolactin 10.0 4.8 - 23.3 ng/mL  Testosterone, Free, Total, SHBG     Status: Abnormal   Collection Time: 07/11/16  9:27 AM  Result Value Ref Range   Testosterone <3 (L) 8 - 48 ng/dL   Testosterone, Free 0.3 0.0 - 4.2 pg/mL   Sex Hormone Binding 206.1 (H) 24.6 - 122.0 nmol/L  TSH     Status: None   Collection Time: 07/11/16  9:27 AM  Result Value Ref Range   TSH 1.430 0.450 - 4.500 uIU/mL  Lipid panel     Status: Abnormal   Collection Time: 07/11/16  9:27 AM  Result Value Ref Range   Cholesterol, Total 234 (H) 100 - 199 mg/dL   Triglycerides 161 0 - 149 mg/dL   HDL 61 >09 mg/dL   VLDL Cholesterol Cal 27 5 - 40 mg/dL   LDL Calculated 604 (H) 0 - 99 mg/dL   Chol/HDL Ratio 3.8 0.0 - 4.4 ratio    Comment:  T. Chol/HDL Ratio                                             Men  Women                               1/2 Avg.Risk  3.4    3.3                                   Avg.Risk  5.0    4.4                                2X Avg.Risk  9.6    7.1                                3X Avg.Risk 23.4   11.0   FSH/LH     Status: None   Collection Time: 07/11/16  9:27 AM  Result Value Ref Range   LH <0.2 mIU/mL    Comment:                     Adult Female:                       Follicular phase      2.4 -  12.6                       Ovulation phase      14.0 -  95.6                       Luteal phase          1.0 -  11.4                       Postmenopausal        7.7 -  58.5    FSH <0.2 mIU/mL    Comment:                     Adult Female:                        Follicular phase      3.5 -  12.5                       Ovulation phase       4.7 -  21.5                       Luteal phase          1.7 -   7.7                       Postmenopausal       25.8 - 134.8   Comprehensive metabolic panel     Status: None   Collection Time: 07/11/16  9:27 AM  Result Value Ref Range   Glucose 81 65 - 99 mg/dL   BUN  10 6 - 20 mg/dL   Creatinine, Ser 1.61 0.57 - 1.00 mg/dL   GFR calc non Af Amer 123 >59 mL/min/1.73   GFR calc Af Amer 141 >59 mL/min/1.73   BUN/Creatinine Ratio 14 9 - 23   Sodium 140 134 - 144 mmol/L   Potassium 4.5 3.5 - 5.2 mmol/L   Chloride 102 96 - 106 mmol/L   CO2 21 18 - 29 mmol/L   Calcium 9.4 8.7 - 10.2 mg/dL   Total Protein 6.9 6.0 - 8.5 g/dL   Albumin 4.1 3.5 - 5.5 g/dL   Globulin, Total 2.8 1.5 - 4.5 g/dL   Albumin/Globulin Ratio 1.5 1.2 - 2.2   Bilirubin Total 0.3 0.0 - 1.2 mg/dL   Alkaline Phosphatase 45 39 - 117 IU/L   AST 14 0 - 40 IU/L   ALT 14 0 - 32 IU/L    Assessment:   1. Counseling and coordination of care  2. PCOS  Plan:   Results reviewed with pt, likely PCOS with symptoms managed by NuvaRing.   Discussed disease management options including: lifestyle modifications and OCPs.   Pt would like to continue using the NuvaRing and start the PCOS diet.   RTC as needed   Gunnar Bulla, CNM

## 2016-08-12 ENCOUNTER — Encounter: Payer: Self-pay | Admitting: Certified Nurse Midwife

## 2016-08-26 ENCOUNTER — Encounter: Payer: Self-pay | Admitting: Certified Nurse Midwife

## 2016-09-09 ENCOUNTER — Telehealth: Payer: Self-pay | Admitting: Certified Nurse Midwife

## 2016-09-09 MED ORDER — SPIRONOLACTONE 100 MG PO TABS: 100.0000 mg | ORAL_TABLET | Freq: Every day | ORAL | 3 refills | Status: DC

## 2016-09-09 NOTE — Telephone Encounter (Signed)
Patient sent message to Marcelino DusterMichelle about 3 weeks ago, while Marcelino DusterMichelle was at a conference. Patient lvm stating that she needs Marcelino DusterMichelle to revisit MyChart message so that any of her concerns can be addressed. Patient did not disclose ant other information. Please advise.

## 2016-09-09 NOTE — Telephone Encounter (Signed)
Telephone call to patient, verified full name and date of birth.   Questions regarding PCOS symptoms management options in addition to OCPs. Pt wonders if Aldactone would help.   Reviewed risk and benefits of medication. Rx Aldactone, see orders.   Reviewed red flag symptoms and when to call. Will come in monthly for CMP check and then twice yearly.   Call back with further needs, questions, or concerns.    [name], CNM

## 2016-10-06 ENCOUNTER — Other Ambulatory Visit: Payer: BC Managed Care – PPO

## 2016-10-17 ENCOUNTER — Other Ambulatory Visit: Payer: Self-pay

## 2016-10-17 ENCOUNTER — Other Ambulatory Visit: Payer: BC Managed Care – PPO

## 2016-10-17 DIAGNOSIS — E282 Polycystic ovarian syndrome: Secondary | ICD-10-CM

## 2016-10-27 ENCOUNTER — Encounter: Payer: Self-pay | Admitting: Certified Nurse Midwife

## 2016-10-27 ENCOUNTER — Other Ambulatory Visit: Payer: BC Managed Care – PPO

## 2016-10-27 DIAGNOSIS — E282 Polycystic ovarian syndrome: Secondary | ICD-10-CM

## 2016-10-28 LAB — COMPREHENSIVE METABOLIC PANEL
A/G RATIO: 1.6 (ref 1.2–2.2)
ALK PHOS: 43 IU/L (ref 39–117)
ALT: 16 IU/L (ref 0–32)
AST: 15 IU/L (ref 0–40)
Albumin: 4.2 g/dL (ref 3.5–5.5)
BUN / CREAT RATIO: 16 (ref 9–23)
BUN: 13 mg/dL (ref 6–20)
Bilirubin Total: 0.3 mg/dL (ref 0.0–1.2)
CHLORIDE: 104 mmol/L (ref 96–106)
CO2: 22 mmol/L (ref 20–29)
Calcium: 9.7 mg/dL (ref 8.7–10.2)
Creatinine, Ser: 0.79 mg/dL (ref 0.57–1.00)
GFR calc non Af Amer: 106 mL/min/{1.73_m2} (ref >59)
GFR, EST AFRICAN AMERICAN: 122 mL/min/{1.73_m2} (ref >59)
GLUCOSE: 102 mg/dL — AB (ref 65–99)
Globulin, Total: 2.6 g/dL (ref 1.5–4.5)
POTASSIUM: 4.1 mmol/L (ref 3.5–5.2)
SODIUM: 142 mmol/L (ref 134–144)
TOTAL PROTEIN: 6.8 g/dL (ref 6.0–8.5)

## 2016-10-30 ENCOUNTER — Other Ambulatory Visit: Payer: Self-pay | Admitting: Certified Nurse Midwife

## 2016-10-30 MED ORDER — SPIRONOLACTONE 100 MG PO TABS: 200.0000 mg | ORAL_TABLET | Freq: Every day | ORAL | 2 refills | Status: DC

## 2016-11-27 ENCOUNTER — Other Ambulatory Visit: Payer: BC Managed Care – PPO

## 2016-11-27 DIAGNOSIS — E282 Polycystic ovarian syndrome: Secondary | ICD-10-CM

## 2016-11-28 LAB — COMPREHENSIVE METABOLIC PANEL
A/G RATIO: 1.7 (ref 1.2–2.2)
ALBUMIN: 4.3 g/dL (ref 3.5–5.5)
ALT: 15 IU/L (ref 0–32)
AST: 18 IU/L (ref 0–40)
Alkaline Phosphatase: 45 IU/L (ref 39–117)
BUN/Creatinine Ratio: 16 (ref 9–23)
BUN: 12 mg/dL (ref 6–20)
Bilirubin Total: 0.4 mg/dL (ref 0.0–1.2)
CALCIUM: 9.8 mg/dL (ref 8.7–10.2)
CO2: 23 mmol/L (ref 20–29)
Chloride: 102 mmol/L (ref 96–106)
Creatinine, Ser: 0.76 mg/dL (ref 0.57–1.00)
GFR, EST AFRICAN AMERICAN: 128 mL/min/{1.73_m2} (ref >59)
GFR, EST NON AFRICAN AMERICAN: 111 mL/min/{1.73_m2} (ref >59)
GLOBULIN, TOTAL: 2.5 g/dL (ref 1.5–4.5)
Glucose: 85 mg/dL (ref 65–99)
Potassium: 4.7 mmol/L (ref 3.5–5.2)
SODIUM: 136 mmol/L (ref 134–144)
TOTAL PROTEIN: 6.8 g/dL (ref 6.0–8.5)

## 2017-01-16 ENCOUNTER — Encounter: Payer: Self-pay | Admitting: Certified Nurse Midwife

## 2017-01-16 ENCOUNTER — Other Ambulatory Visit: Payer: Self-pay | Admitting: Certified Nurse Midwife

## 2017-01-16 DIAGNOSIS — T500X5A Adverse effect of mineralocorticoids and their antagonists, initial encounter: Secondary | ICD-10-CM

## 2017-01-16 DIAGNOSIS — E282 Polycystic ovarian syndrome: Secondary | ICD-10-CM

## 2017-01-16 MED ORDER — SPIRONOLACTONE 100 MG PO TABS: 200.0000 mg | ORAL_TABLET | Freq: Every day | ORAL | 2 refills | Status: DC

## 2017-04-17 ENCOUNTER — Encounter: Payer: Self-pay | Admitting: Certified Nurse Midwife

## 2017-04-20 ENCOUNTER — Other Ambulatory Visit: Payer: BC Managed Care – PPO

## 2017-04-20 ENCOUNTER — Encounter: Payer: BC Managed Care – PPO | Admitting: Certified Nurse Midwife

## 2017-04-22 ENCOUNTER — Encounter: Payer: Self-pay | Admitting: Certified Nurse Midwife

## 2017-04-27 ENCOUNTER — Other Ambulatory Visit: Payer: BC Managed Care – PPO

## 2017-04-27 ENCOUNTER — Other Ambulatory Visit: Payer: Self-pay | Admitting: Certified Nurse Midwife

## 2017-04-27 DIAGNOSIS — E282 Polycystic ovarian syndrome: Secondary | ICD-10-CM

## 2017-04-27 DIAGNOSIS — T500X5A Adverse effect of mineralocorticoids and their antagonists, initial encounter: Secondary | ICD-10-CM

## 2017-04-28 LAB — COMPREHENSIVE METABOLIC PANEL
ALK PHOS: 41 IU/L (ref 39–117)
ALT: 12 IU/L (ref 0–32)
AST: 18 IU/L (ref 0–40)
Albumin/Globulin Ratio: 1.5 (ref 1.2–2.2)
Albumin: 4.1 g/dL (ref 3.5–5.5)
BUN / CREAT RATIO: 10 (ref 9–23)
BUN: 8 mg/dL (ref 6–20)
Bilirubin Total: 0.2 mg/dL (ref 0.0–1.2)
CHLORIDE: 105 mmol/L (ref 96–106)
CO2: 21 mmol/L (ref 20–29)
CREATININE: 0.79 mg/dL (ref 0.57–1.00)
Calcium: 9.7 mg/dL (ref 8.7–10.2)
GFR calc Af Amer: 122 mL/min/{1.73_m2} (ref >59)
GFR calc non Af Amer: 106 mL/min/{1.73_m2} (ref >59)
Globulin, Total: 2.7 g/dL (ref 1.5–4.5)
Glucose: 83 mg/dL (ref 65–99)
Potassium: 4.7 mmol/L (ref 3.5–5.2)
Sodium: 141 mmol/L (ref 134–144)
Total Protein: 6.8 g/dL (ref 6.0–8.5)

## 2017-04-30 ENCOUNTER — Other Ambulatory Visit: Payer: Self-pay | Admitting: *Deleted

## 2017-04-30 ENCOUNTER — Encounter: Payer: Self-pay | Admitting: Certified Nurse Midwife

## 2017-04-30 MED ORDER — SPIRONOLACTONE 100 MG PO TABS: 100.0000 mg | ORAL_TABLET | Freq: Two times a day (BID) | ORAL | 3 refills | Status: DC

## 2017-05-04 ENCOUNTER — Encounter: Payer: Self-pay | Admitting: Certified Nurse Midwife

## 2017-05-04 ENCOUNTER — Ambulatory Visit (INDEPENDENT_AMBULATORY_CARE_PROVIDER_SITE_OTHER): Payer: BC Managed Care – PPO | Admitting: Certified Nurse Midwife

## 2017-05-04 VITALS — BP 109/71 | HR 80 | Ht 66.0 in | Wt 139.0 lb

## 2017-05-04 DIAGNOSIS — N898 Other specified noninflammatory disorders of vagina: Secondary | ICD-10-CM | POA: Diagnosis not present

## 2017-05-04 DIAGNOSIS — Z01419 Encounter for gynecological examination (general) (routine) without abnormal findings: Secondary | ICD-10-CM

## 2017-05-04 DIAGNOSIS — Z124 Encounter for screening for malignant neoplasm of cervix: Secondary | ICD-10-CM

## 2017-05-04 DIAGNOSIS — E282 Polycystic ovarian syndrome: Secondary | ICD-10-CM

## 2017-05-04 MED ORDER — ETONOGESTREL-ETHINYL ESTRADIOL 0.12-0.015 MG/24HR VA RING: VAGINAL_RING | VAGINAL | 12 refills | Status: DC

## 2017-05-04 MED ORDER — METRONIDAZOLE 0.75 % VA GEL: 1.0000 | Freq: Every day | VAGINAL | 1 refills | Status: DC

## 2017-05-04 MED ORDER — SPIRONOLACTONE 100 MG PO TABS: 200.0000 mg | ORAL_TABLET | Freq: Every day | ORAL | 5 refills | Status: DC

## 2017-05-04 NOTE — Progress Notes (Signed)
ANNUAL PREVENTATIVE CARE GYN  ENCOUNTER NOTE  Subjective:       Olivia Gillespie is a 22 y.o. G0P0000 female here for a routine annual gynecologic exam.  Current complaints: 1.  Vaginal discharge for the last few days.   Denies difficulty breathing or respiratory distress, chest pain, abdominal pain, excessive vaginal bleeding, dysuria, and leg pain or swelling.   Lost 20 pounds with low carb diet. Getting married in two (2) weeks after seven (7) years of dating.   Gynecologic History  Patient's last menstrual period was 04/29/2017.  Contraception: NuvaRing vaginal inserts  Last Pap: 2016. Results were: normal  Obstetric History  OB History  Gravida Para Term Preterm AB Living  0 0 0 0 0 0  SAB TAB Ectopic Multiple Live Births  0 0 0 0 0        Past Medical History:  Diagnosis Date  . Bacterial vaginitis   . Depression     Past Surgical History:  Procedure Laterality Date  . WISDOM TOOTH EXTRACTION  2013    Current Outpatient Medications on File Prior to Visit  Medication Sig Dispense Refill  . buPROPion (WELLBUTRIN SR) 150 MG 12 hr tablet Take 150 mg by mouth 2 (two) times daily.    . hydrOXYzine (ATARAX/VISTARIL) 10 MG tablet Take 10 mg by mouth 2 (two) times daily at 10 AM and 5 PM.    . Melatonin 3 MG CAPS Take 3 mg by mouth at bedtime.    Marland Kitchen omeprazole (PRILOSEC) 40 MG capsule Take 1 capsule (40 mg total) by mouth daily. (Patient not taking: Reported on 07/22/2016) 30 capsule 0   No current facility-administered medications on file prior to visit.     No Known Allergies  Social History   Socioeconomic History  . Marital status: Single    Spouse name: Not on file  . Number of children: Not on file  . Years of education: Not on file  . Highest education level: Not on file  Social Needs  . Financial resource strain: Not on file  . Food insecurity - worry: Not on file  . Food insecurity - inability: Not on file  . Transportation needs - medical: Not on file   . Transportation needs - non-medical: Not on file  Occupational History  . Not on file  Tobacco Use  . Smoking status: Never Smoker  . Smokeless tobacco: Never Used  Substance and Sexual Activity  . Alcohol use: No  . Drug use: No  . Sexual activity: Yes    Birth control/protection: Inserts  Other Topics Concern  . Not on file  Social History Narrative  . Not on file    Family History  Problem Relation Age of Onset  . Hypertension Mother   . Mental illness Maternal Grandmother   . Hyperlipidemia Paternal Grandfather   . Diabetes Paternal Grandfather     The following portions of the patient's history were reviewed and updated as appropriate: allergies, current medications, past family history, past medical history, past social history, past surgical history and problem list.  Review of Systems  ROS negative except as noted above. Information obtained from patient.    Objective:   BP 109/71   Pulse 80   Ht 5\' 6"  (1.676 m)   Wt 139 lb (63 kg)   LMP 04/29/2017   BMI 22.44 kg/m    CONSTITUTIONAL: Well-developed, well-nourished female in no acute distress.   PSYCHIATRIC: Normal mood and affect. Normal behavior. Normal judgment and thought  content.  NEUROLGIC: Alert and oriented to person, place, and time. Normal muscle tone coordination. No cranial nerve deficit noted.  HENT:  Normocephalic, atraumatic, External right and left ear normal. Oropharynx is clear and moist  EYES: Conjunctivae and EOM are normal. Pupils are equal, round, and reactive to light. No scleral icterus.   NECK: Normal range of motion, supple, no masses.  Normal thyroid.   SKIN: Skin is warm and dry. No rash noted. Not diaphoretic. No erythema. No pallor.  CARDIOVASCULAR: Normal heart rate noted, regular rhythm, no murmur.  RESPIRATORY: Clear to auscultation bilaterally. Effort and breath sounds normal, no problems with respiration noted.  BREASTS: Symmetric in size. No masses, skin changes,  nipple drainage, or lymphadenopathy.  ABDOMEN: Soft, normal bowel sounds, no distention noted.  No tenderness, rebound or guarding.   PELVIC:  External Genitalia: Normal  Vagina: Brown discharge present, positive Whiff  Cervix: Normal, Pap collected  Uterus: Normal  Adnexa: Normal   MUSCULOSKELETAL: Normal range of motion. No tenderness.  No cyanosis, clubbing, or edema.  2+ distal pulses.  LYMPHATIC: No Axillary, Supraclavicular, or Inguinal Adenopathy.  Assessment:   Annual gynecologic examination 22 y.o.  Contraception: NuvaRing vaginal inserts   Normal BMI   Problem List Items Addressed This Visit    None    Visit Diagnoses    Well woman exam with routine gynecological exam    -  Primary   Relevant Orders   Pap IG, rfx HPV ASCU,16/18   Polycystic ovarian syndrome       Vaginal discharge       Screening for cervical cancer       Relevant Orders   Pap IG, rfx HPV ASCU,16/18      Plan:   Pap: Pap, Reflex if ASCUS  Labs: See results   Routine preventative health maintenance measures emphasized: Exercise/Diet/Weight control, Tobacco Warnings, Alcohol/Substance use risks, Stress Management, Peer Pressure Issues and Safe Sex  Rx NuvaRing, Metrogel, and Aldactone, see orders.   Reviewed red flag symptoms and when to call.   RTC x 6 months for lab visit only: CMP.   RTC x 1 year for Annual Exam or sooner if needed.   Gunnar BullaJenkins Michelle Jasiel Belisle, CNM [facility], [facility]

## 2017-05-04 NOTE — Patient Instructions (Signed)
Bacterial Vaginosis Bacterial vaginosis is an infection of the vagina. It happens when too many germs (bacteria) grow in the vagina. This infection puts you at risk for infections from sex (STIs). Treating this infection can lower your risk for some STIs. You should also treat this if you are pregnant. It can cause your baby to be born early. Follow these instructions at home: Medicines  Take over-the-counter and prescription medicines only as told by your doctor.  Take or use your antibiotic medicine as told by your doctor. Do not stop taking or using it even if you start to feel better. General instructions  If you your sexual partner is a woman, tell her that you have this infection. She needs to get treatment if she has symptoms. If you have a female partner, he does not need to be treated.  During treatment: ? Avoid sex. ? Do not douche. ? Avoid alcohol as told. ? Avoid breastfeeding as told.  Drink enough fluid to keep your pee (urine) clear or pale yellow.  Keep your vagina and butt (rectum) clean. ? Wash the area with warm water every day. ? Wipe from front to back after you use the toilet.  Keep all follow-up visits as told by your doctor. This is important. Preventing this condition  Do not douche.  Use only warm water to wash around your vagina.  Use protection when you have sex. This includes: ? Latex condoms. ? Dental dams.  Limit how many people you have sex with. It is best to only have sex with the same person (be monogamous).  Get tested for STIs. Have your partner get tested.  Wear underwear that is cotton or lined with cotton.  Avoid tight pants and pantyhose. This is most important in summer.  Do not use any products that have nicotine or tobacco in them. These include cigarettes and e-cigarettes. If you need help quitting, ask your doctor.  Do not use illegal drugs.  Limit how much alcohol you drink. Contact a doctor if:  Your symptoms do not get  better, even after you are treated.  You have more discharge or pain when you pee (urinate).  You have a fever.  You have pain in your belly (abdomen).  You have pain with sex.  Your bleed from your vagina between periods. Summary  This infection happens when too many germs (bacteria) grow in the vagina.  Treating this condition can lower your risk for some infections from sex (STIs).  You should also treat this if you are pregnant. It can cause early (premature) birth.  Do not stop taking or using your antibiotic medicine even if you start to feel better. This information is not intended to replace advice given to you by your health care provider. Make sure you discuss any questions you have with your health care provider. Document Released: 01/01/2008 Document Revised: 12/08/2015 Document Reviewed: 12/08/2015 Elsevier Interactive Patient Education  2017 [employer] 18-39 Years, Female Preventive care refers to lifestyle choices and visits with your health care provider that can promote health and wellness. What does preventive care include?  A yearly physical exam. This is also called an annual well check.  Dental exams once or twice a year.  Routine eye exams. Ask your health care provider how often you should have your eyes checked.  Personal lifestyle choices, including: ? Daily care of your teeth and gums. ? Regular physical activity. ? Eating a healthy diet. ? Avoiding tobacco and drug use. ?  Limiting alcohol use. ? Practicing safe sex. ? Taking vitamin and mineral supplements as recommended by your health care provider. What happens during an annual well check? The services and screenings done by your health care provider during your annual well check will depend on your age, overall health, lifestyle risk factors, and family history of disease. Counseling Your health care provider may ask you questions about your:  Alcohol use.  Tobacco  use.  Drug use.  Emotional well-being.  Home and relationship well-being.  Sexual activity.  Eating habits.  Work and work Statistician.  Method of birth control.  Menstrual cycle.  Pregnancy history.  Screening You may have the following tests or measurements:  Height, weight, and BMI.  Diabetes screening. This is done by checking your blood sugar (glucose) after you have not eaten for a while (fasting).  Blood pressure.  Lipid and cholesterol levels. These may be checked every 5 years starting at age 63.  Skin check.  Hepatitis C blood test.  Hepatitis B blood test.  Sexually transmitted disease (STD) testing.  BRCA-related cancer screening. This may be done if you have a family history of breast, ovarian, tubal, or peritoneal cancers.  Pelvic exam and Pap test. This may be done every 3 years starting at age 58. Starting at age 63, this may be done every 5 years if you have a Pap test in combination with an HPV test.  Discuss your test results, treatment options, and if necessary, the need for more tests with your health care provider. Vaccines Your health care provider may recommend certain vaccines, such as:  Influenza vaccine. This is recommended every year.  Tetanus, diphtheria, and acellular pertussis (Tdap, Td) vaccine. You may need a Td booster every 10 years.  Varicella vaccine. You may need this if you have not been vaccinated.  HPV vaccine. If you are 63 or younger, you may need three doses over 6 months.  Measles, mumps, and rubella (MMR) vaccine. You may need at least one dose of MMR. You may also need a second dose.  Pneumococcal 13-valent conjugate (PCV13) vaccine. You may need this if you have certain conditions and were not previously vaccinated.  Pneumococcal polysaccharide (PPSV23) vaccine. You may need one or two doses if you smoke cigarettes or if you have certain conditions.  Meningococcal vaccine. One dose is recommended if you are  age 35-21 years and a first-year college student living in a residence hall, or if you have one of several medical conditions. You may also need additional booster doses.  Hepatitis A vaccine. You may need this if you have certain conditions or if you travel or work in places where you may be exposed to hepatitis A.  Hepatitis B vaccine. You may need this if you have certain conditions or if you travel or work in places where you may be exposed to hepatitis B.  Haemophilus influenzae type b (Hib) vaccine. You may need this if you have certain risk factors.  Talk to your health care provider about which screenings and vaccines you need and how often you need them. This information is not intended to replace advice given to you by your health care provider. Make sure you discuss any questions you have with your health care provider. Document Released: 05/20/2001 Document Revised: 12/12/2015 Document Reviewed: 01/23/2015 Elsevier Interactive Patient Education  [employer].

## 2017-05-05 LAB — PAP IG, RFX HPV ASCU,16/18: PAP SMEAR COMMENT: 0

## 2017-05-25 ENCOUNTER — Other Ambulatory Visit: Payer: Self-pay | Admitting: Certified Nurse Midwife

## 2017-05-25 ENCOUNTER — Encounter: Payer: Self-pay | Admitting: Certified Nurse Midwife

## 2017-05-25 MED ORDER — METRONIDAZOLE 0.75 % VA GEL: 1.0000 | Freq: Every day | VAGINAL | 5 refills | Status: DC

## 2017-05-25 NOTE — Progress Notes (Signed)
met 

## 2017-07-28 ENCOUNTER — Encounter: Payer: BC Managed Care – PPO | Admitting: Certified Nurse Midwife

## 2017-07-31 ENCOUNTER — Encounter: Payer: Self-pay | Admitting: Certified Nurse Midwife

## 2017-07-31 ENCOUNTER — Ambulatory Visit: Payer: BC Managed Care – PPO | Admitting: Certified Nurse Midwife

## 2017-07-31 VITALS — BP 102/64 | HR 78 | Ht 66.0 in | Wt 146.8 lb

## 2017-07-31 DIAGNOSIS — N926 Irregular menstruation, unspecified: Secondary | ICD-10-CM

## 2017-07-31 DIAGNOSIS — Z8742 Personal history of other diseases of the female genital tract: Secondary | ICD-10-CM | POA: Diagnosis not present

## 2017-07-31 LAB — POCT URINE PREGNANCY: Preg Test, Ur: NEGATIVE

## 2017-07-31 NOTE — Patient Instructions (Signed)
Diet for Polycystic Ovarian Syndrome Polycystic ovary syndrome (PCOS) is a disorder of the chemical messengers (hormones) that regulate menstruation. The condition causes important hormones to be out of balance. PCOS can:  Make your periods irregular or stop.  Cause cysts to develop on the ovaries.  Make it difficult to get pregnant.  Stop your body from responding to the effects of insulin (insulin resistance), which can lead to obesity and diabetes.  Changing what you eat can help manage PCOS and improve your health. It can help you lose weight and improve the way your body uses insulin. What is my plan?  Eat breakfast, lunch, and dinner plus two snacks every day.  Include protein in each meal and snack.  Choose whole grains instead of products made with refined flour.  Eat a variety of foods.  Exercise regularly as told by your health care provider. What do I need to know about this eating plan? If you are overweight or obese, pay attention to how many calories you eat. Cutting down on calories can help you lose weight. Work with your health care provider or dietitian to figure out how many calories you need each day. What foods can I eat? Grains Whole grains, such as whole wheat. Whole-grain breads, crackers, cereals, and pasta. Unsweetened oatmeal, bulgur, barley, quinoa, or brown rice. Corn or whole-wheat flour tortillas. Vegetables  Lettuce. Spinach. Peas. Beets. Cauliflower. Cabbage. Broccoli. Carrots. Tomatoes. Squash. Eggplant. Herbs. Peppers. Onions. Cucumbers. Brussels sprouts. Fruits Berries. Bananas. Apples. Oranges. Grapes. Papaya. Mango. Pomegranate. Kiwi. Grapefruit. Cherries. Meats and Other Protein Sources Lean proteins, such as fish, chicken, beans, eggs, and tofu. Dairy Low-fat dairy products, such as skim milk, cheese sticks, and yogurt. Beverages Low-fat or fat-free drinks, such as water, low-fat milk, sugar-free drinks, and 100% fruit  juice. Condiments Ketchup. Mustard. Barbecue sauce. Relish. Low-fat or fat-free mayonnaise. Fats and Oils Olive oil or canola oil. Walnuts and almonds. The items listed above may not be a complete list of recommended foods or beverages. Contact your dietitian for more options. What foods are not recommended? Foods high in calories or fat. Fried foods. Sweets. Products made from refined white flour, including white bread, pastries, white rice, and pasta. The items listed above may not be a complete list of foods and beverages to avoid. Contact your dietitian for more information. This information is not intended to replace advice given to you by your health care provider. Make sure you discuss any questions you have with your health care provider. Document Released: 07/16/2015 Document Revised: 08/30/2015 Document Reviewed: 04/05/2014 Elsevier Interactive Patient Education  2018 [employer]  Polycystic Ovarian Syndrome Polycystic ovarian syndrome (PCOS) is a common hormonal disorder among women of reproductive age. In most women with PCOS, many small fluid-filled sacs (cysts) grow on the ovaries, and the cysts are not part of a normal menstrual cycle. PCOS can cause problems with your menstrual periods and make it difficult to get pregnant. It can also cause an increased risk of miscarriage with pregnancy. If it is not treated, PCOS can lead to serious health problems, such as diabetes and heart disease. What are the causes? The cause of PCOS is not known, but it may be the result of a combination of certain factors, such as:  Irregular menstrual cycle.  High levels of certain hormones (androgens).  Problems with the hormone that helps to control blood sugar (insulin resistance).  Certain genes.  What increases the risk? This condition is more likely to develop in women who   have a family history of PCOS. What are the signs or symptoms? Symptoms of PCOS may include:  Multiple ovarian  cysts.  Infrequent periods or no periods.  Periods that are too frequent or too heavy.  Unpredictable periods.  Inability to get pregnant (infertility) because of not ovulating.  Increased growth of hair on the face, chest, stomach, back, thumbs, thighs, or toes.  Acne or oily skin. Acne may develop during adulthood, and it may not respond to treatment.  Pelvic pain.  Weight gain or obesity.  Patches of thickened and dark brown or black skin on the neck, arms, breasts, or thighs (acanthosis nigricans).  Excess hair growth on the face, chest, abdomen, or upper thighs (hirsutism).  How is this diagnosed? This condition is diagnosed based on:  Your medical history.  A physical exam, including a pelvic exam. Your health care provider may look for areas of increased hair growth on your skin.  Tests, such as: ? Ultrasound. This may be used to examine the ovaries and the lining of the uterus (endometrium) for cysts. ? Blood tests. These may be used to check levels of sugar (glucose), female hormone (testosterone), and female hormones (estrogen and progesterone) in your blood.  How is this treated? There is no cure for PCOS, but treatment can help to manage symptoms and prevent more health problems from developing. Treatment varies depending on:  Your symptoms.  Whether you want to have a baby or whether you need birth control (contraception).  Treatment may include nutrition and lifestyle changes along with:  Progesterone hormone to start a menstrual period.  Birth control pills to help you have regular menstrual periods.  Medicines to make you ovulate, if you want to get pregnant.  Medicine to reduce excessive hair growth.  Surgery, in severe cases. This may involve making small holes in one or both of your ovaries. This decreases the amount of testosterone that your body produces.  Follow these instructions at home:  Take over-the-counter and prescription medicines only  as told by your health care provider.  Follow a healthy meal plan. This can help you reduce the effects of PCOS. ? Eat a healthy diet that includes lean proteins, complex carbohydrates, fresh fruits and vegetables, low-fat dairy products, and healthy fats. Make sure to eat enough fiber.  If you are overweight, lose weight as told by your health care provider. ? Losing 10% of your body weight may improve symptoms. ? Your health care provider can determine how much weight loss is best for you and can help you lose weight safely.  Keep all follow-up visits as told by your health care provider. This is important. Contact a health care provider if:  Your symptoms do not get better with medicine.  You develop new symptoms. This information is not intended to replace advice given to you by your health care provider. Make sure you discuss any questions you have with your health care provider. Document Released: 07/18/2004 Document Revised: 11/20/2015 Document Reviewed: 09/09/2015 Elsevier Interactive Patient Education  2018 [employer]  

## 2017-07-31 NOTE — Progress Notes (Signed)
GYN ENCOUNTER NOTE  Subjective:       Olivia Gillespie is a 22 y.o. G0P0000 female is here for gynecologic evaluation of the following issues:  1. Missed menses  Denies difficulty breathing or respiratory distress, chest pain, abdominal pain, vaginal bleeding, dysuria, and leg pain or swelling.   History significant for PCOS and recurrent BV. Recently married.    Gynecologic History  Patient's last menstrual period was 06/01/2017.  Contraception: NuvaRing vaginal inserts  Last Pap: 2019. Results were: normal  Obstetric History OB History  Gravida Para Term Preterm AB Living  0 0 0 0 0 0  SAB TAB Ectopic Multiple Live Births  0 0 0 0 0    Past Medical History:  Diagnosis Date  . Bacterial vaginitis   . Depression     Past Surgical History:  Procedure Laterality Date  . WISDOM TOOTH EXTRACTION  2013    Current Outpatient Medications on File Prior to Visit  Medication Sig Dispense Refill  . buPROPion (WELLBUTRIN SR) 150 MG 12 hr tablet Take 150 mg by mouth 2 (two) times daily.    . hydrOXYzine (ATARAX/VISTARIL) 10 MG tablet Take 10 mg by mouth 2 (two) times daily at 10 AM and 5 PM.    . Melatonin 3 MG CAPS Take 3 mg by mouth at bedtime.    . metroNIDAZOLE (METROGEL) 0.75 % vaginal gel Place 1 Applicatorful vaginally at bedtime. Apply one applicatorful to vagina at bedtime for 10 days, then twice a week for 6 months. 70 g 5  . spironolactone (ALDACTONE) 100 MG tablet Take 2 tablets (200 mg total) by mouth daily. 60 tablet 5  . etonogestrel-ethinyl estradiol (NUVARING) 0.12-0.015 MG/24HR vaginal ring Insert vaginally and leave in place for 3 consecutive weeks, then remove for 1 week. (Patient not taking: Reported on 07/31/2017) 1 each 12  . metroNIDAZOLE (METROGEL) 0.75 % vaginal gel Place 1 Applicatorful vaginally at bedtime. Apply one applicatorful to vagina at bedtime for 5 days (Patient not taking: Reported on 07/31/2017) 70 g 1  . omeprazole (PRILOSEC) 40 MG capsule Take 1  capsule (40 mg total) by mouth daily. (Patient not taking: Reported on 07/22/2016) 30 capsule 0   No current facility-administered medications on file prior to visit.     No Known Allergies  Social History   Socioeconomic History  . Marital status: Single    Spouse name: Not on file  . Number of children: Not on file  . Years of education: Not on file  . Highest education level: Not on file  Occupational History  . Not on file  Social Needs  . Financial resource strain: Not on file  . Food insecurity:    Worry: Not on file    Inability: Not on file  . Transportation needs:    Medical: Not on file    Non-medical: Not on file  Tobacco Use  . Smoking status: Never Smoker  . Smokeless tobacco: Never Used  Substance and Sexual Activity  . Alcohol use: No  . Drug use: No  . Sexual activity: Yes    Birth control/protection: Condom  Lifestyle  . Physical activity:    Days per week: Not on file    Minutes per session: Not on file  . Stress: Not on file  Relationships  . Social connections:    Talks on phone: Not on file    Gets together: Not on file    Attends religious service: Not on file    Active member of  club or organization: Not on file    Attends meetings of clubs or organizations: Not on file    Relationship status: Not on file  . Intimate partner violence:    Fear of current or ex partner: Not on file    Emotionally abused: Not on file    Physically abused: Not on file    Forced sexual activity: Not on file  Other Topics Concern  . Not on file  Social History Narrative  . Not on file    Family History  Problem Relation Age of Onset  . Hypertension Mother   . Mental illness Maternal Grandmother   . Hyperlipidemia Paternal Grandfather   . Diabetes Paternal Grandfather     The following portions of the patient's history were reviewed and updated as appropriate: allergies, current medications, past family history, past medical history, past social history,  past surgical history and problem list.  Review of Systems  Review of Systems - Negative except as noted above.  History obtained from the patient.   Objective:   BP 102/64   Pulse 78   Ht 5\' 6"  (1.676 m)   Wt 146 lb 12.8 oz (66.6 kg)   LMP 06/01/2017   BMI 23.69 kg/m    CONSTITUTIONAL: Well-developed, well-nourished female in no acute distress.   ABDOMEN: Soft, non distended; Non tender.  No Organomegaly.  PELVIC:  External Genitalia: Normal  Vagina: Normal  Cervix: Normal  Uterus: Normal size, shape,consistency, mobile  Adnexa: Normal  LABS: urine pregnancy test negative  Assessment:   1. Missed periods  - POCT urine pregnancy - Beta HCG, Quant  2. History of PCOS   Plan:   Labs: Beta hCG.   If beta negative for pregnancy, then will prescribe provera. After completion, patient my restart NuvaRing.  Reviewed red flag symptoms and when to call.   RTC as needed.    Gunnar BullaJenkins Michelle Lawhorn, CNM [facility]

## 2017-07-31 NOTE — Progress Notes (Signed)
Pt LMP 06/01/17 cycle was regular. Pt stated that she haven't had her cycle for 60 days. No other concerns.

## 2017-08-01 LAB — BETA HCG QUANT (REF LAB): hCG Quant: <1 m[IU]/mL

## 2017-08-04 ENCOUNTER — Encounter: Payer: Self-pay | Admitting: Certified Nurse Midwife

## 2017-08-05 ENCOUNTER — Other Ambulatory Visit: Payer: Self-pay

## 2017-08-06 ENCOUNTER — Other Ambulatory Visit: Payer: Self-pay

## 2017-08-06 MED ORDER — MEDROXYPROGESTERONE ACETATE 10 MG PO TABS: 10.0000 mg | ORAL_TABLET | Freq: Every day | ORAL | 2 refills | Status: DC

## 2017-08-06 NOTE — Telephone Encounter (Signed)
Script sent and pt informed through [employer].

## 2017-08-26 ENCOUNTER — Encounter: Payer: Self-pay | Admitting: Certified Nurse Midwife

## 2017-10-11 ENCOUNTER — Other Ambulatory Visit: Payer: Self-pay | Admitting: Certified Nurse Midwife

## 2017-11-29 ENCOUNTER — Other Ambulatory Visit: Payer: Self-pay | Admitting: Certified Nurse Midwife

## 2017-11-30 ENCOUNTER — Encounter: Payer: Self-pay | Admitting: Certified Nurse Midwife

## 2017-12-03 ENCOUNTER — Other Ambulatory Visit: Payer: BC Managed Care – PPO

## 2017-12-03 DIAGNOSIS — E282 Polycystic ovarian syndrome: Secondary | ICD-10-CM

## 2017-12-04 LAB — POTASSIUM: POTASSIUM: 4 mmol/L (ref 3.5–5.2)

## 2017-12-08 ENCOUNTER — Encounter: Payer: Self-pay | Admitting: Certified Nurse Midwife

## 2017-12-09 ENCOUNTER — Other Ambulatory Visit: Payer: Self-pay

## 2017-12-09 MED ORDER — SPIRONOLACTONE 100 MG PO TABS: 200.0000 mg | ORAL_TABLET | Freq: Every day | ORAL | 5 refills | Status: DC

## 2018-04-19 DIAGNOSIS — F419 Anxiety disorder, unspecified: Secondary | ICD-10-CM | POA: Insufficient documentation

## 2018-04-19 DIAGNOSIS — F32A Depression, unspecified: Secondary | ICD-10-CM | POA: Insufficient documentation

## 2018-04-20 ENCOUNTER — Encounter: Payer: Self-pay | Admitting: Certified Nurse Midwife

## 2018-04-20 ENCOUNTER — Ambulatory Visit (INDEPENDENT_AMBULATORY_CARE_PROVIDER_SITE_OTHER): Payer: BC Managed Care – PPO | Admitting: Certified Nurse Midwife

## 2018-04-20 VITALS — BP 94/64 | HR 72 | Ht 66.0 in | Wt 144.1 lb

## 2018-04-20 DIAGNOSIS — Z01419 Encounter for gynecological examination (general) (routine) without abnormal findings: Secondary | ICD-10-CM | POA: Diagnosis not present

## 2018-04-20 DIAGNOSIS — E282 Polycystic ovarian syndrome: Secondary | ICD-10-CM

## 2018-04-20 DIAGNOSIS — Z3044 Encounter for surveillance of vaginal ring hormonal contraceptive device: Secondary | ICD-10-CM | POA: Diagnosis not present

## 2018-04-20 MED ORDER — ETONOGESTREL-ETHINYL ESTRADIOL 0.12-0.015 MG/24HR VA RING: VAGINAL_RING | VAGINAL | 3 refills | Status: DC

## 2018-04-20 NOTE — Patient Instructions (Addendum)
WE WOULD LOVE TO HEAR FROM YOU!!!!   Thank you Olivia Gillespie for visiting [facility].  Providing our patients with the best experience possible is really important to [place], and we hope that you felt that on your recent visit. The most valuable feedback we get comes from [employer]!!    If you receive a survey please take a couple of minutes to let us know how we did.Thank you for continuing to trust [place] with your care.   [facility]    Polycystic Ovarian Syndrome  Polycystic ovarian syndrome (PCOS) is a common hormonal disorder among women of reproductive age. In most women with PCOS, many small fluid-filled sacs (cysts) grow on the ovaries, and the cysts are not part of a normal menstrual cycle. PCOS can cause problems with your menstrual periods and make it difficult to get pregnant. It can also cause an increased risk of miscarriage with pregnancy. If it is not treated, PCOS can lead to serious health problems, such as diabetes and heart disease. What are the causes? The cause of PCOS is not known, but it may be the result of a combination of certain factors, such as:  Irregular menstrual cycle.  High levels of certain hormones (androgens).  Problems with the hormone that helps to control blood sugar (insulin resistance).  Certain genes. What increases the risk? This condition is more likely to develop in women who have a family history of PCOS. What are the signs or symptoms? Symptoms of PCOS may include:  Multiple ovarian cysts.  Infrequent periods or no periods.  Periods that are too frequent or too heavy.  Unpredictable periods.  Inability to get pregnant (infertility) because of not ovulating.  Increased growth of hair on the face, chest, stomach, back, thumbs, thighs, or toes.  Acne or oily skin. Acne may develop during adulthood, and it may not respond to treatment.  Pelvic pain.  Weight gain or obesity.  Patches of thickened and dark brown or  black skin on the neck, arms, breasts, or thighs (acanthosis nigricans).  Excess hair growth on the face, chest, abdomen, or upper thighs (hirsutism). How is this diagnosed? This condition is diagnosed based on:  Your medical history.  A physical exam, including a pelvic exam. Your health care provider may look for areas of increased hair growth on your skin.  Tests, such as: ? Ultrasound. This may be used to examine the ovaries and the lining of the uterus (endometrium) for cysts. ? Blood tests. These may be used to check levels of sugar (glucose), female hormone (testosterone), and female hormones (estrogen and progesterone) in your blood. How is this treated? There is no cure for PCOS, but treatment can help to manage symptoms and prevent more health problems from developing. Treatment varies depending on:  Your symptoms.  Whether you want to have a baby or whether you need birth control (contraception). Treatment may include nutrition and lifestyle changes along with:  Progesterone hormone to start a menstrual period.  Birth control pills to help you have regular menstrual periods.  Medicines to make you ovulate, if you want to get pregnant.  Medicine to reduce excessive hair growth.  Surgery, in severe cases. This may involve making small holes in one or both of your ovaries. This decreases the amount of testosterone that your body produces. Follow these instructions at home:  Take over-the-counter and prescription medicines only as told by your health care provider.  Follow a healthy meal plan. This can help you  reduce the effects of PCOS. ? Eat a healthy diet that includes lean proteins, complex carbohydrates, fresh fruits and vegetables, low-fat dairy products, and healthy fats. Make sure to eat enough fiber.  If you are overweight, lose weight as told by your health care provider. ? Losing 10% of your body weight may improve symptoms. ? Your health care provider can  determine how much weight loss is best for you and can help you lose weight safely.  Keep all follow-up visits as told by your health care provider. This is important. Contact a health care provider if:  Your symptoms do not get better with medicine.  You develop new symptoms. This information is not intended to replace advice given to you by your health care provider. Make sure you discuss any questions you have with your health care provider. Document Released: 07/18/2004 Document Revised: 11/20/2015 Document Reviewed: 09/09/2015 Elsevier Interactive Patient Education  2019 [place]. Diet for Polycystic Ovary Syndrome Polycystic ovary syndrome (PCOS) is a disorder of the chemicals (hormones) that regulate a woman's reproductive system, including monthly periods (menstruation). The condition causes important hormones to be out of balance. PCOS can:  Stop your periods or make them irregular.  Cause cysts to develop on your ovaries.  Make it difficult to get pregnant.  Stop your body from responding to the effects of insulin (insulin resistance). Insulin resistance can lead to obesity and diabetes. Changing what you eat can help you manage PCOS and improve your health. Following a balanced diet can help you lose weight and improve the way that your body uses insulin. What are tips for following this plan?  Follow a balanced diet for meals and snacks. Eat breakfast, lunch, dinner, and one or two snacks every day.  Include protein in each meal and snack.  Choose whole grains instead of products that are made with refined flour.  Eat a variety of foods.  Exercise regularly as told by your health care provider. Aim to do 30 or more minutes of exercise on most days of the week.  If you are overweight or obese: ? Pay attention to how many calories you eat. Cutting down on calories can help you lose weight. ? Work with your health care provider or a diet and nutrition specialist  (dietitian) to figure out how many calories you need each day. What foods can I eat?  Fruits Include a variety of colors and types. All fruits are helpful for PCOS. Vegetables Include a variety of colors and types. All vegetables are helpful for PCOS. Grains Whole grains, such as whole wheat. Whole-grain breads, crackers, cereals, and pasta. Unsweetened oatmeal, bulgur, barley, quinoa, and brown rice. Tortillas made from corn or whole-wheat flour. Meats and other proteins Low-fat (lean) proteins, such as fish, chicken, beans, eggs, and tofu. Dairy Low-fat dairy products, such as skim milk, cheese sticks, and yogurt. Beverages Low-fat or fat-free drinks, such as water, low-fat milk, sugar-free drinks, and small amounts of 100% fruit juice. Seasonings and condiments Ketchup. Mustard. Barbecue sauce. Relish. Low-fat or fat-free mayonnaise. Fats and oils Olive oil or canola oil. Walnuts and almonds. The items listed above may not be a complete list of recommended foods and beverages. Contact a dietitian for more options. What foods are not recommended? Foods that are high in calories or fat. Fried foods. Sweets. Products that are made from refined white flour, including white bread, pastries, white rice, and pasta. The items listed above may not be a complete list of foods and beverages  to avoid. Contact a dietitian for more information. Summary  PCOS is a hormonal imbalance that affects a woman's reproductive system.  You can help to manage your PCOS by exercising regularly and eating a healthy, varied diet of vegetables, fruit, whole grains, low-fat (lean) protein, and low-fat dairy products.  Changing what you eat can improve the way that your body uses insulin, help your hormones reach normal levels, and help you lose weight. This information is not intended to replace advice given to you by your health care provider. Make sure you discuss any questions you have with your health care  provider. Document Released: 07/16/2015 Document Revised: 01/26/2017 Document Reviewed: 01/26/2017 Elsevier Interactive Patient Education  2019 [place] 18-39 Years, Female Preventive care refers to lifestyle choices and visits with your health care provider that can promote health and wellness. What does preventive care include?   A yearly physical exam. This is also called an annual well check.  Dental exams once or twice a year.  Routine eye exams. Ask your health care provider how often you should have your eyes checked.  Personal lifestyle choices, including: ? Daily care of your teeth and gums. ? Regular physical activity. ? Eating a healthy diet. ? Avoiding tobacco and drug use. ? Limiting alcohol use. ? Practicing safe sex. ? Taking vitamin and mineral supplements as recommended by your health care provider. What happens during an annual well check? The services and screenings done by your health care provider during your annual well check will depend on your age, overall health, lifestyle risk factors, and family history of disease. Counseling Your health care provider may ask you questions about your:  Alcohol use.  Tobacco use.  Drug use.  Emotional well-being.  Home and relationship well-being.  Sexual activity.  Eating habits.  Work and work Statistician.  Method of birth control.  Menstrual cycle.  Pregnancy history. Screening You may have the following tests or measurements:  Height, weight, and BMI.  Diabetes screening. This is done by checking your blood sugar (glucose) after you have not eaten for a while (fasting).  Blood pressure.  Lipid and cholesterol levels. These may be checked every 5 years starting at age 107.  Skin check.  Hepatitis C blood test.  Hepatitis B blood test.  Sexually transmitted disease (STD) testing.  BRCA-related cancer screening. This may be done if you have a family history of breast,  ovarian, tubal, or peritoneal cancers.  Pelvic exam and Pap test. This may be done every 3 years starting at age 72. Starting at age 47, this may be done every 5 years if you have a Pap test in combination with an HPV test. Discuss your test results, treatment options, and if necessary, the need for more tests with your health care provider. Vaccines Your health care provider may recommend certain vaccines, such as:  Influenza vaccine. This is recommended every year.  Tetanus, diphtheria, and acellular pertussis (Tdap, Td) vaccine. You may need a Td booster every 10 years.  Varicella vaccine. You may need this if you have not been vaccinated.  HPV vaccine. If you are 65 or younger, you may need three doses over 6 months.  Measles, mumps, and rubella (MMR) vaccine. You may need at least one dose of MMR. You may also need a second dose.  Pneumococcal 13-valent conjugate (PCV13) vaccine. You may need this if you have certain conditions and were not previously vaccinated.  Pneumococcal polysaccharide (PPSV23) vaccine. You may need one or  two doses if you smoke cigarettes or if you have certain conditions.  Meningococcal vaccine. One dose is recommended if you are age 51-21 years and a first-year college student living in a residence hall, or if you have one of several medical conditions. You may also need additional booster doses.  Hepatitis A vaccine. You may need this if you have certain conditions or if you travel or work in places where you may be exposed to hepatitis A.  Hepatitis B vaccine. You may need this if you have certain conditions or if you travel or work in places where you may be exposed to hepatitis B.  Haemophilus influenzae type b (Hib) vaccine. You may need this if you have certain risk factors. Talk to your health care provider about which screenings and vaccines you need and how often you need them. This information is not intended to replace advice given to you by your  health care provider. Make sure you discuss any questions you have with your health care provider. Document Released: 05/20/2001 Document Revised: 11/04/2016 Document Reviewed: 01/23/2015 Elsevier Interactive Patient Education  2019 [employer] Ethinyl Estradiol; Etonogestrel vaginal ring What is this medicine? ETHINYL ESTRADIOL; ETONOGESTREL (ETH in il es tra DYE ole; et oh noe JES trel) vaginal ring is a flexible, vaginal ring used as a contraceptive (birth control method). This medicine combines 2 types of female hormones, an estrogen and a progestin. This ring is used to prevent ovulation and pregnancy. Each ring is effective for 1 month. This medicine may be used for other purposes; ask your health care provider or pharmacist if you have questions. COMMON BRAND NAME(S): NuvaRing What should I tell my health care provider before I take this medicine? They need to know if you have any of these conditions: -abnormal vaginal bleeding -blood vessel disease or blood clots -breast, cervical, endometrial, ovarian, liver, or uterine cancer -diabetes -gallbladder disease -having surgery -heart disease or recent heart attack -high blood pressure -high cholesterol or triglycerides -history of irregular heartbeat or heart valve problems -kidney disease -liver disease -migraine headaches -protein C deficiency -protein S deficiency -recently had a baby, miscarriage, or abortion -stroke -systemic lupus erythematosus (SLE) -tobacco smoker -your age is more than 25 years old -an unusual or allergic reaction to estrogens, progestins, other medicines, foods, dyes, or preservatives -pregnant or trying to get pregnant -breast-feeding How should I use this medicine? Insert the ring into your vagina as directed. Follow the directions on the prescription label. The ring will remain place for 3 weeks and is then removed for a 1-week break. A new ring is inserted 1 week after the last ring was  removed, on the same day of the week. Check often to make sure the ring is still in place. If the ring was out of the vagina for an unknown amount of time, you may not be protected from pregnancy. Perform a pregnancy test and call your doctor. Do not use more often than directed. A patient package insert for the product will be given with each prescription and refill. Read this sheet carefully each time. The sheet may change frequently. Contact your pediatrician regarding the use of this medicine in children. Special care may be needed. Overdosage: If you think you have taken too much of this medicine contact a poison control center or emergency room at once. NOTE: This medicine is only for you. Do not share this medicine with others. What if I miss a dose? You will need to use the ring  exactly as directed. It is very important to follow the schedule every cycle. If you do not use the ring as directed, you may not be protected from pregnancy. If the ring should slip out, is lost, or if you leave it in longer or shorter than you should, contact your health care professional for advice. What may interact with this medicine? Do not take this medicine with the following medications: -dasabuvir; ombitasvir; paritaprevir; ritonavir -ombitasvir; paritaprevir; ritonavir -vaginal lubricants or other vaginal products that are oil-based or silicone-based This medicine may also interact with the following medications: -acetaminophen -antibiotics or medicines for infections, especially rifampin, rifabutin, rifapentine, and griseofulvin, and possibly penicillins or tetracyclines -aprepitant or fosaprepitant -armodafinil -ascorbic acid (vitamin C) -barbiturate medicines, such as phenobarbital or primidone -bosentan -certain antiviral medicines for hepatitis, HIV or AIDS -certain medicines for cancer treatment -certain medicines for seizures like carbamazepine, clobazam, felbamate, lamotrigine, oxcarbazepine,  phenytoin, rufinamide, topiramate -certain medicines for treating high cholesterol -cyclosporine -dantrolene -elagolix -flibanserin -grapefruit juice -lesinurad -medicines for diabetes -medicines to treat fungal infections, such as griseofulvin, miconazole, fluconazole, ketoconazole, itraconazole, posaconazole or voriconazole -mifepristone -mitotane -modafinil -morphine -mycophenolate -St. John's wort -tamoxifen -temazepam -theophylline or aminophylline -thyroid hormones -tizanidine -tranexamic acid -ulipristal -warfarin This list may not describe all possible interactions. Give your health care provider a list of all the medicines, herbs, non-prescription drugs, or dietary supplements you use. Also tell them if you smoke, drink alcohol, or use illegal drugs. Some items may interact with your medicine. What should I watch for while using this medicine? Visit your doctor or health care professional for regular checks on your progress. You will need a regular breast and pelvic exam and Pap smear while on this medicine. Check with your doctor or health care professional to see if you need an additional method of contraception during the first cycle that you use this ring. Female condoms (made with natural rubber latex, polyisoprene, and polyurethane) and spermicides may be used. Do not use a diaphragm, cervical cap, or a female condom, as the ring can interfere with these birth control methods and their proper placement. If you have any reason to think you are pregnant, stop using this medicine right away and contact your doctor or health care professional. If you are using this medicine for hormone related problems, it may take several cycles of use to see improvement in your condition. Smoking increases the risk of getting a blood clot or having a stroke while you are using hormonal birth control, especially if you are more than 24 years old. You are strongly advised not to smoke. Some  women are prone to getting dark patches on the skin of the face (cholasma). Your risk of getting chloasma with this medicine is higher if you had chloasma during a pregnancy. Keep out of the sun. If you cannot avoid being in the sun, wear protective clothing and use sunscreen. Do not use sun lamps or tanning beds/booths. This medicine can make your body retain fluid, making your fingers, hands, or ankles swell. Your blood pressure can go up. Contact your doctor or health care professional if you feel you are retaining fluid. If you are going to have elective surgery, you may need to stop using this medicine before the surgery. Consult your health care professional for advice. This medicine does not protect you against HIV infection (AIDS) or any other sexually transmitted diseases. What side effects may I notice from receiving this medicine? Side effects that you should report to your doctor or  health care professional as soon as possible: -allergic reactions such as skin rash or itching, hives, swelling of the lips, mouth, tongue, or throat -depression -high blood pressure -migraines or severe, sudden headaches -signs and symptoms of a blood clot such as breathing problems; changes in vision; chest pain; severe, sudden headache; pain, swelling, warmth in the leg; trouble speaking; sudden numbness or weakness of the face, arm or leg -signs and symptoms of infection like fever or chills with dizziness and a sunburn-like rash, or pain or trouble passing urine -stomach pain -symptoms of vaginal infection like itching, irritation or unusual discharge -yellowing of the eyes or skin Side effects that usually do not require medical attention (report these to your doctor or health care professional if they continue or are bothersome): -acne -breast pain, tenderness -irregular vaginal bleeding or spotting, particularly during the first month of use -mild headache -nausea -painful periods -vomiting This  list may not describe all possible side effects. Call your doctor for medical advice about side effects. You may report side effects to FDA at 1-800-FDA-1088. Where should I keep my medicine? Keep out of the reach of children. Store unopened rings in the original foil pouch at room temperature between 20 and 25 degrees C (68 and 77 degrees F) for up to 4 months. Protect from light. Do not store above 30 degrees C (86 degrees F). Throw away any unused medicine after the expiration date. A ring may only be used for 1 cycle (1 month). After the 3-week cycle, a used ring is removed and should be placed in the re-closable foil pouch and discarded in the trash out of reach of children and pets. Do NOT flush down the toilet. NOTE: This sheet is a summary. It may not cover all possible information. If you have questions about this medicine, talk to your doctor, pharmacist, or health care provider.  2019 Elsevier/[employer] (2016-11-21 14:41:10)

## 2018-04-20 NOTE — Progress Notes (Signed)
ANNUAL PREVENTATIVE CARE GYN  ENCOUNTER NOTE  Subjective:       Olivia Gillespie is a 23 y.o. G0P0000 female here for a routine annual gynecologic exam.  Current complaints: 1. Irregular menses.   Doing well since getting married. Irregular menses resumed after stopping nuvaring.   Denies difficulty breathing or respiratory distress, chest pain, abdominal pain, excessive vaginal bleeding, dysuria, and leg pain or swelling.    Gynecologic History  Patient's last menstrual period was 03/16/2018 (exact date).  Period Duration (Days): 10 Period Pattern: (!) Irregular Menstrual Flow: Heavy Menstrual Control: Other (Comment)(menstrual cup) Dysmenorrhea: None  Contraception: none   Last Pap: 05/04/2017. Results were: normal   Obstetric History  OB History  Gravida Para Term Preterm AB Living  0 0 0 0 0 0  SAB TAB Ectopic Multiple Live Births  0 0 0 0 0    Past Medical History:  Diagnosis Date  . Bacterial vaginitis   . Depression   . PCOS (polycystic ovarian syndrome)     Past Surgical History:  Procedure Laterality Date  . WISDOM TOOTH EXTRACTION  2013    Current Outpatient Medications on File Prior to Visit  Medication Sig Dispense Refill  . buPROPion (WELLBUTRIN SR) 150 MG 12 hr tablet Take 150 mg by mouth 2 (two) times daily.    . Melatonin 3 MG CAPS Take 3 mg by mouth at bedtime.    . propranolol (INDERAL) 20 MG tablet Take 20 mg by mouth as needed.    Marland Kitchen spironolactone (ALDACTONE) 100 MG tablet Take 2 tablets (200 mg total) by mouth daily. 60 tablet 5   No current facility-administered medications on file prior to visit.     No Known Allergies  Social History   Socioeconomic History  . Marital status: Single    Spouse name: Not on file  . Number of children: Not on file  . Years of education: Not on file  . Highest education level: Not on file  Occupational History  . Not on file  Social Needs  . Financial resource strain: Not on file  . Food  insecurity:    Worry: Not on file    Inability: Not on file  . Transportation needs:    Medical: Not on file    Non-medical: Not on file  Tobacco Use  . Smoking status: Never Smoker  . Smokeless tobacco: Never Used  Substance and Sexual Activity  . Alcohol use: No  . Drug use: No  . Sexual activity: Yes    Birth control/protection: None  Lifestyle  . Physical activity:    Days per week: Not on file    Minutes per session: Not on file  . Stress: Not on file  Relationships  . Social connections:    Talks on phone: Not on file    Gets together: Not on file    Attends religious service: Not on file    Active member of club or organization: Not on file    Attends meetings of clubs or organizations: Not on file    Relationship status: Not on file  . Intimate partner violence:    Fear of current or ex partner: Not on file    Emotionally abused: Not on file    Physically abused: Not on file    Forced sexual activity: Not on file  Other Topics Concern  . Not on file  Social History Narrative  . Not on file    Family History  Problem Relation Age of  Onset  . Hypertension Mother   . Mental illness Maternal Grandmother   . Hyperlipidemia Paternal Grandfather   . Diabetes Paternal Grandfather   . Breast cancer Neg Hx   . Ovarian cancer Neg Hx   . Colon cancer Neg Hx     The following portions of the patient's history were reviewed and updated as appropriate: allergies, current medications, past family history, past medical history, past social history, past surgical history and problem list.  Review of Systems  ROS negative except as noted above. Information obtained for patient.    Objective:   BP 94/64   Pulse 72   Ht 5\' 6"  (1.676 m)   Wt 144 lb 1.6 oz (65.4 kg)   LMP 03/16/2018 (Exact Date)   BMI 23.26 kg/m    CONSTITUTIONAL: Well-developed, well-nourished female in no acute distress.   PSYCHIATRIC: Normal mood and affect. Normal behavior. Normal judgment and  thought content.  NEUROLGIC: Alert and oriented to person, place, and time. Normal muscle tone coordination. No cranial nerve deficit noted.  HENT:  Normocephalic, atraumatic, External right and left ear normal.   EYES: Conjunctivae and EOM are normal. Pupils are equal and round.   NECK: Normal range of motion, supple, no masses.  Normal thyroid.   SKIN: Skin is warm and dry. No rash noted. Not diaphoretic. No erythema. No pallor.  CARDIOVASCULAR: Normal heart rate noted, regular rhythm, no murmur.  RESPIRATORY: Clear to auscultation bilaterally. Effort and breath sounds normal, no problems with respiration noted.  BREASTS: Symmetric in size. No masses, skin changes, nipple drainage, or lymphadenopathy.  ABDOMEN: Soft, normal bowel sounds, no distention noted.  No tenderness, rebound or guarding.   PELVIC:  External Genitalia: Normal  Vagina: Normal  Cervix: Normal  Uterus: Normal  Adnexa: Normal   MUSCULOSKELETAL: Normal range of motion. No tenderness.  No cyanosis, clubbing, or edema.  2+ distal pulses.  LYMPHATIC: No Axillary, Supraclavicular, or Inguinal Adenopathy.  Recent Results (from the past 2160 hour(s))  Testosterone, Free, Total, SHBG     Status: Abnormal   Collection Time: 04/20/18  3:39 PM  Result Value Ref Range   Testosterone <3 (L) 8 - 48 ng/dL   Testosterone, Free 0.6 0.0 - 4.2 pg/mL   Sex Hormone Binding 34.8 24.6 - 122.0 nmol/L  TSH     Status: None   Collection Time: 04/20/18  3:39 PM  Result Value Ref Range   TSH 1.680 0.450 - 4.500 uIU/mL  FSH/LH     Status: None   Collection Time: 04/20/18  3:39 PM  Result Value Ref Range   LH 2.0 mIU/mL    Comment:                     Adult Female:                       Follicular phase      2.4 -  12.6                       Ovulation phase      14.0 -  95.6                       Luteal phase          1.0 -  11.4                       Postmenopausal  7.7 -  58.5    FSH 3.9 mIU/mL    Comment:                      Adult Female:                       Follicular phase      3.5 -  12.5                       Ovulation phase       4.7 -  21.5                       Luteal phase          1.7 -   7.7                       Postmenopausal       25.8 - 134.8   Hemoglobin A1c     Status: None   Collection Time: 04/20/18  3:39 PM  Result Value Ref Range   Hgb A1c MFr Bld 5.2 4.8 - 5.6 %    Comment:          Prediabetes: 5.7 - 6.4          Diabetes: >6.4          Glycemic control for adults with diabetes: <7.0    Est. average glucose Bld gHb Est-mCnc 103 mg/dL     Assessment:   Annual gynecologic examination 23 y.o.   Contraception: NuvaRing vaginal inserts   Normal BMI   Problem List Items Addressed This Visit      Endocrine   PCOS (polycystic ovarian syndrome)   Relevant Orders   Testosterone, Free, Total, SHBG   TSH   FSH/LH   Hemoglobin A1c    Other Visit Diagnoses    Well woman exam    -  Primary   Relevant Orders   Testosterone, Free, Total, SHBG   TSH   FSH/LH   Hemoglobin A1c   Encounter for surveillance of vaginal ring hormonal contraceptive device          Plan:   Pap: Not needed  Labs: See orders   Rx: Nuvaring, see orders   Routine preventative health maintenance measures emphasized: Exercise/Diet/Weight control, Tobacco Warnings, Alcohol/Substance use risks, Stress Management and Peer Pressure Issues; see AVS  Reviewed red flag symptoms and when to call  RTC x 1 year for ANNUAL EXAM or sooner if needed   Gunnar Bulla, CNM [facility], [facility] 04/20/18 3:32 PM

## 2018-04-20 NOTE — Progress Notes (Signed)
Patient here for annual exam.  Patient was diagnosed with PCOS 04/2017 and will like to discuss irregular menstrual periods.

## 2018-04-21 LAB — TESTOSTERONE, FREE, TOTAL, SHBG
Sex Hormone Binding: 34.8 nmol/L (ref 24.6–122.0)
Testosterone, Free: 0.6 pg/mL (ref 0.0–4.2)

## 2018-04-21 LAB — HEMOGLOBIN A1C
ESTIMATED AVERAGE GLUCOSE: 103 mg/dL
Hgb A1c MFr Bld: 5.2 % (ref 4.8–5.6)

## 2018-04-21 LAB — TSH: TSH: 1.68 u[IU]/mL (ref 0.450–4.500)

## 2018-04-21 LAB — FSH/LH
FSH: 3.9 m[IU]/mL
LH: 2 m[IU]/mL

## 2018-06-21 ENCOUNTER — Telehealth: Payer: Self-pay

## 2018-06-21 ENCOUNTER — Telehealth: Payer: Self-pay | Admitting: Certified Nurse Midwife

## 2018-06-21 NOTE — Telephone Encounter (Signed)
The patient states she needs to do labs to get her spironolactone refilled.  She is on the lab schedule for Wed, but there is no order, and the patient sees Marcelino Duster, please advise, thanks.

## 2018-06-21 NOTE — Telephone Encounter (Signed)
Order for potassium level entered for labs 06/23/18- spironalactone use

## 2018-06-21 NOTE — Telephone Encounter (Signed)
Done- per SS

## 2018-06-23 ENCOUNTER — Other Ambulatory Visit: Payer: Self-pay

## 2018-06-23 ENCOUNTER — Other Ambulatory Visit: Payer: BC Managed Care – PPO

## 2018-06-23 DIAGNOSIS — E282 Polycystic ovarian syndrome: Secondary | ICD-10-CM

## 2018-06-24 ENCOUNTER — Encounter: Payer: Self-pay | Admitting: Certified Nurse Midwife

## 2018-06-24 LAB — POTASSIUM: Potassium: 4.7 mmol/L (ref 3.5–5.2)

## 2018-06-25 ENCOUNTER — Other Ambulatory Visit: Payer: Self-pay

## 2018-06-25 MED ORDER — SPIRONOLACTONE 100 MG PO TABS: 200.0000 mg | ORAL_TABLET | Freq: Every day | ORAL | 5 refills | Status: DC

## 2018-10-18 ENCOUNTER — Other Ambulatory Visit: Payer: Self-pay

## 2018-10-18 ENCOUNTER — Telehealth: Payer: Self-pay | Admitting: Certified Nurse Midwife

## 2018-10-18 MED ORDER — FLUCONAZOLE 150 MG PO TABS: 150.0000 mg | ORAL_TABLET | Freq: Once | ORAL | 1 refills | Status: AC

## 2018-10-18 NOTE — Telephone Encounter (Signed)
The patient called and stated that she has a yeast infection, and over the counter medication is not working. The patient is requesting to have a prescription called in to clear up her yeast infection. Please advise.

## 2018-10-25 ENCOUNTER — Other Ambulatory Visit (HOSPITAL_COMMUNITY)
Admission: RE | Admit: 2018-10-25 | Discharge: 2018-10-25 | Disposition: A | Discharge status: Home / Self Care | Payer: BC Managed Care – PPO | Source: Ambulatory Visit | Attending: Certified Nurse Midwife | Admitting: Certified Nurse Midwife

## 2018-10-25 ENCOUNTER — Other Ambulatory Visit: Payer: Self-pay

## 2018-10-25 ENCOUNTER — Encounter: Payer: Self-pay | Admitting: Certified Nurse Midwife

## 2018-10-25 ENCOUNTER — Ambulatory Visit: Payer: BC Managed Care – PPO | Admitting: Certified Nurse Midwife

## 2018-10-25 VITALS — BP 101/74 | HR 78 | Ht 66.0 in | Wt 138.0 lb

## 2018-10-25 DIAGNOSIS — L292 Pruritus vulvae: Secondary | ICD-10-CM | POA: Diagnosis present

## 2018-10-25 DIAGNOSIS — L299 Pruritus, unspecified: Secondary | ICD-10-CM | POA: Diagnosis not present

## 2018-10-25 NOTE — Progress Notes (Signed)
Patient c/o itching all over body for over a month, went to PCP 7/15 and was started on Hydroxyzine and Loratadine with no relief.  Patient c/o daily vaginal itching and will like to r/o vaginal infection, using Vagisil cream with relief, no vaginal d/c.

## 2018-10-25 NOTE — Progress Notes (Signed)
GYN ENCOUNTER NOTE  Subjective:       Olivia Gillespie is a 23 y.o. G0P0000 female is here for gynecologic evaluation of the following issues:  1. Itching "all over" for the last month  Visit with PCP on 10/20/2018, started on Claritin and hydroxyzine with minimal relief of symptoms.   Notes daily external vaginal/perineal itching, symptoms are relieved with OTC vagisil cream.   Denies difficulty breathing or respiratory distress, chest pain, abdominal pain, excessive vaginal bleeding, increased vaginal discharge, and leg pain or swelling.    Gynecologic History  Patient's last menstrual period was 10/03/2018.  Contraception: NuvaRing vaginal inserts  Last Pap: 04/2017. Results were: normal   Obstetric History  OB History  Gravida Para Term Preterm AB Living  0 0 0 0 0 0  SAB TAB Ectopic Multiple Live Births  0 0 0 0 0    Past Medical History:  Diagnosis Date  . Bacterial vaginitis   . Depression   . PCOS (polycystic ovarian syndrome)     Past Surgical History:  Procedure Laterality Date  . WISDOM TOOTH EXTRACTION  2013    Current Outpatient Medications on File Prior to Visit  Medication Sig Dispense Refill  . buPROPion (WELLBUTRIN SR) 150 MG 12 hr tablet Take 150 mg by mouth 2 (two) times daily.    Marland Kitchen etonogestrel-ethinyl estradiol (NUVARING) 0.12-0.015 MG/24HR vaginal ring Insert vaginally and leave in place for 3 consecutive weeks, then remove for 1 week. 3 each 3  . hydrOXYzine (ATARAX/VISTARIL) 25 MG tablet Take 25 mg by mouth at bedtime.    Marland Kitchen loratadine (CLARITIN) 10 MG tablet Take 10 mg by mouth daily.    . Melatonin 3 MG CAPS Take 3 mg by mouth at bedtime.    . propranolol (INDERAL) 20 MG tablet Take 20 mg by mouth as needed.    Marland Kitchen spironolactone (ALDACTONE) 100 MG tablet Take 2 tablets (200 mg total) by mouth daily. 60 tablet 5   No current facility-administered medications on file prior to visit.     No Known Allergies  Social History   Socioeconomic  History  . Marital status: Single    Spouse name: Not on file  . Number of children: Not on file  . Years of education: Not on file  . Highest education level: Not on file  Occupational History  . Not on file  Social Needs  . Financial resource strain: Not on file  . Food insecurity    Worry: Not on file    Inability: Not on file  . Transportation needs    Medical: Not on file    Non-medical: Not on file  Tobacco Use  . Smoking status: Never Smoker  . Smokeless tobacco: Never Used  Substance and Sexual Activity  . Alcohol use: No  . Drug use: No  . Sexual activity: Yes    Birth control/protection: Other-see comments    Comment: Nuvaring  Lifestyle  . Physical activity    Days per week: Not on file    Minutes per session: Not on file  . Stress: Not on file  Relationships  . Social Herbalist on phone: Not on file    Gets together: Not on file    Attends religious service: Not on file    Active member of club or organization: Not on file    Attends meetings of clubs or organizations: Not on file    Relationship status: Not on file  . Intimate partner violence  Fear of current or ex partner: Not on file    Emotionally abused: Not on file    Physically abused: Not on file    Forced sexual activity: Not on file  Other Topics Concern  . Not on file  Social History Narrative  . Not on file    Family History  Problem Relation Age of Onset  . Hypertension Mother   . Mental illness Maternal Grandmother   . Hyperlipidemia Paternal Grandfather   . Diabetes Paternal Grandfather   . Breast cancer Neg Hx   . Ovarian cancer Neg Hx   . Colon cancer Neg Hx     The following portions of the patient's history were reviewed and updated as appropriate: allergies, current medications, past family history, past medical history, past social history, past surgical history and problem list.  Review of Systems  ROS negative except as noted above. Information obtained  from patient.   Objective:   BP 101/74   Pulse 78   Ht 5\' 6"  (1.676 m)   Wt 138 lb (62.6 kg)   LMP 10/03/2018   BMI 22.27 kg/m    CONSTITUTIONAL: Well-developed, well-nourished female in no acute distress.   PELVIC:  External Genitalia: Erythema noted  Vagina: Normal  Cervix: Normal  Uterus: Normal size, shape,consistency, mobile  Adnexa: Normal   MUSCULOSKELETAL: Normal range of motion. No tenderness.  No cyanosis, clubbing, or edema.  Assessment:   1. Vulvovaginal itching  - Cervicovaginal ancillary only  2. Itching  - Bile acids, total - Comprehensive metabolic panel     Plan:   Labs: see orders, will contact patient with results  Education regarding home treatment measures  Reviewed red flag symptoms and when to call  RTC if symptoms worsen or fail to improve   Gunnar BullaJenkins Michelle Shatoria [place], CNM [facility], [facility] 10/25/18 11:28 AM

## 2018-10-25 NOTE — Patient Instructions (Signed)
Pruritus Pruritus is an itchy feeling on the skin. One of the most common causes is dry skin, but many different things can cause itching. Most cases of itching do not require medical attention. Sometimes itchy skin can turn into a rash. Follow these instructions at home: Skin care   Apply moisturizing lotion to your skin as needed. Lotion that contains petroleum jelly is best.  Take medicines or apply medicated creams only as told by your health care provider. This may include: ? Corticosteroid cream. ? Anti-itch lotions. ? Oral antihistamines.  Apply a cool, wet cloth (cool compress) to the affected areas.  Take baths with one of the following: ? Epsom salts. You can get these at your local pharmacy or grocery store. Follow the instructions on the packaging. ? Baking soda. Pour a small amount into the bath as told by your health care provider. ? Colloidal oatmeal. You can get this at your local pharmacy or grocery store. Follow the instructions on the packaging.  Apply baking soda paste to your skin. To make the paste, stir water into a small amount of baking soda until it reaches a paste-like consistency.  Do not scratch your skin.  Do not take hot showers or baths, which can make itching worse. A cool shower may help with itching as long as you apply moisturizing lotion after the shower.  Do not use scented soaps, detergents, perfumes, and cosmetic products. Instead, use gentle, unscented versions of these items. General instructions  Avoid wearing tight clothes.  Keep a journal to help find out what is causing your itching. Write down: ? What you eat and drink. ? What cosmetic products you use. ? What soaps or detergents you use. ? What you wear, including jewelry.  Use a humidifier. This keeps the air moist, which helps to prevent dry skin.  Be aware of any changes in your itchiness. Contact a health care provider if:  The itching does not go away after several days.   You are unusually thirsty or urinating more than normal.  Your skin tingles or feels numb.  Your skin or the white parts of your eyes turn yellow (jaundice).  You feel weak.  You have any of the following: ? Night sweats. ? Tiredness (fatigue). ? Weight loss. ? Abdominal pain. Summary  Pruritus is an itchy feeling on the skin. One of the most common causes is dry skin, but many different conditions and factors can cause itching.  Apply moisturizing lotion to your skin as needed. Lotion that contains petroleum jelly is best.  Take medicines or apply medicated creams only as told by your health care provider.  Do not take hot showers or baths. Do not use scented soaps, detergents, perfumes, or cosmetic products. This information is not intended to replace advice given to you by your health care provider. Make sure you discuss any questions you have with your health care provider. Document Released: 12/04/2010 Document Revised: 04/07/2017 Document Reviewed: 04/07/2017 Elsevier Patient Education  2020 [place].   Anal Pruritus Anal pruritus is when the area around the opening of the butt (anus) feels itchy. This itchy feeling often happens when the area is moist. Moisture may come from sweat or from a small amount of poop (stool) that is left on the area. Many other things can also cause the itching. These include:  Perfumed soaps and sprays.  Colored or scented toilet paper.  Washing the area too much.  Eating certain foods.  Having watery poop (diarrhea).  Certain skin conditions or other medical conditions. The itching often goes away with home care. Scratching can damage the skin and make the problem worse. Follow these instructions at home: Skin care      Keep clean by washing your body well. ? Clean the affected area with wet toilet paper or a wet washcloth. Do this after you poop and at bedtime. ? Avoid using soaps on this area. ? Dry the area well. Pat  the area dry with toilet paper or a towel.  Do not scrub the area with anything, even toilet paper.  Do not scratch the itchy area. Scratching makes the itching worse.  Take a warm water bath (sitz bath) as told by your doctor. ? A sitz bath is a warm water bath that only comes up to your hips and covers your butt. A sitz bath may done at home in a bathtub. It may also be done with a portable sitz bath that fits over the toilet. ? Pat the area dry with a soft cloth after each bath.  Use creams or ointments as told by your doctor.  Do not use bubble bath, scented toilet paper, or genital deodorants. General instructions  Watch for any changes in your symptoms.  Use over-the-counter and prescription medicines only as told by your doctor.  Avoid using medicines that help you poop (laxatives).  Talk with your doctor about increasing the fiber in your diet. This can help keep your poop normal if you often have loose poop.  Limit or avoid foods that may cause your symptoms. These foods may include: ? Spicy foods, such as salsa, jalapeo peppers, and spicy seasonings. ? Foods with caffeine in them. ? Beer. ? Milk products. ? Chocolate. ? Nuts. ? Citrus fruits. ? Tomatoes.  Wear cotton underwear and loose clothing.  Keep all follow-up visits as told by your doctor. This is important. Contact a doctor if:  Your itching does not get better after a few days.  Your itching gets worse.  You have a fever.  You have redness, swelling, or pain in the itchy area.  You have fluid, blood, or pus coming from the itchy area. Summary  Anal pruritus is when the area around the opening of the butt (anus) feels itchy.  Use over-the-counter and prescription medicines only as told by your doctor.  Keep clean by washing your body well as told by your doctor.  Talk with your doctor about taking fiber pills (supplements). This information is not intended to replace advice given to you by  your health care provider. Make sure you discuss any questions you have with your health care provider. Document Released: 12/04/2010 Document Revised: 08/03/2017 Document Reviewed: 08/03/2017 Elsevier Patient Education  2020 [employer]

## 2018-10-27 LAB — COMPREHENSIVE METABOLIC PANEL
ALT: 13 IU/L (ref 0–32)
AST: 17 IU/L (ref 0–40)
Albumin/Globulin Ratio: 2.1 (ref 1.2–2.2)
Albumin: 4.7 g/dL (ref 3.9–5.0)
Alkaline Phosphatase: 53 IU/L (ref 39–117)
BUN/Creatinine Ratio: 16 (ref 9–23)
BUN: 13 mg/dL (ref 6–20)
Bilirubin Total: 0.3 mg/dL (ref 0.0–1.2)
CO2: 22 mmol/L (ref 20–29)
Calcium: 10.2 mg/dL (ref 8.7–10.2)
Chloride: 101 mmol/L (ref 96–106)
Creatinine, Ser: 0.81 mg/dL (ref 0.57–1.00)
GFR calc Af Amer: 117 mL/min/{1.73_m2} (ref >59)
GFR calc non Af Amer: 101 mL/min/{1.73_m2} (ref >59)
Globulin, Total: 2.2 g/dL (ref 1.5–4.5)
Glucose: 84 mg/dL (ref 65–99)
Potassium: 4.5 mmol/L (ref 3.5–5.2)
Sodium: 138 mmol/L (ref 134–144)
Total Protein: 6.9 g/dL (ref 6.0–8.5)

## 2018-10-27 LAB — BILE ACIDS, TOTAL: Bile Acids Total: 3.8 umol/L (ref 0.0–10.0)

## 2018-10-28 LAB — CERVICOVAGINAL ANCILLARY ONLY
Bacterial vaginitis: NEGATIVE
Candida vaginitis: NEGATIVE
Trichomonas: NEGATIVE

## 2018-12-06 ENCOUNTER — Other Ambulatory Visit: Payer: Self-pay | Admitting: Certified Nurse Midwife

## 2019-03-31 ENCOUNTER — Other Ambulatory Visit: Payer: Self-pay | Admitting: Certified Nurse Midwife

## 2019-04-04 ENCOUNTER — Other Ambulatory Visit: Payer: Self-pay | Admitting: Certified Nurse Midwife

## 2019-04-04 MED ORDER — ETONOGESTREL-ETHINYL ESTRADIOL 0.12-0.015 MG/24HR VA RING: VAGINAL_RING | VAGINAL | 0 refills | Status: DC

## 2019-04-27 ENCOUNTER — Other Ambulatory Visit: Payer: Self-pay

## 2019-04-28 ENCOUNTER — Other Ambulatory Visit: Payer: Self-pay

## 2019-04-28 MED ORDER — ETONOGESTREL-ETHINYL ESTRADIOL 0.12-0.015 MG/24HR VA RING: VAGINAL_RING | VAGINAL | 0 refills | Status: DC

## 2019-04-28 NOTE — Telephone Encounter (Signed)
Refill sent. Patient has appointment scheduled for February 2021

## 2019-06-02 ENCOUNTER — Encounter: Payer: BC Managed Care – PPO | Admitting: Certified Nurse Midwife

## 2019-06-21 ENCOUNTER — Other Ambulatory Visit: Payer: Self-pay | Admitting: Certified Nurse Midwife

## 2019-07-07 ENCOUNTER — Encounter: Payer: Self-pay | Admitting: Certified Nurse Midwife

## 2019-07-07 ENCOUNTER — Ambulatory Visit (INDEPENDENT_AMBULATORY_CARE_PROVIDER_SITE_OTHER): Payer: BC Managed Care – PPO | Admitting: Certified Nurse Midwife

## 2019-07-07 ENCOUNTER — Other Ambulatory Visit: Payer: Self-pay

## 2019-07-07 VITALS — BP 110/74 | HR 90 | Ht 66.0 in | Wt 155.6 lb

## 2019-07-07 DIAGNOSIS — Z8742 Personal history of other diseases of the female genital tract: Secondary | ICD-10-CM

## 2019-07-07 DIAGNOSIS — Z01419 Encounter for gynecological examination (general) (routine) without abnormal findings: Secondary | ICD-10-CM | POA: Diagnosis not present

## 2019-07-07 DIAGNOSIS — Z3044 Encounter for surveillance of vaginal ring hormonal contraceptive device: Secondary | ICD-10-CM

## 2019-07-07 MED ORDER — ETONOGESTREL-ETHINYL ESTRADIOL 0.12-0.015 MG/24HR VA RING: VAGINAL_RING | VAGINAL | 3 refills | Status: DC

## 2019-07-07 NOTE — Progress Notes (Signed)
AE- no complaints.  Nuvaring- working will- RF given  PCP-[facility] - [facility]

## 2019-07-07 NOTE — Patient Instructions (Signed)
Pap Test Why am I having this test? A Pap test, also called a Pap smear, is a screening test to check for signs of:  Cancer of the vagina, cervix, and uterus. The cervix is the lower part of the uterus that opens into the vagina.  Infection.  Changes that may be a sign that cancer is developing (precancerous changes). Women need this test on a regular basis. In general, you should have a Pap test every 3 years until you reach menopause or age 26. Women aged 30-60 may choose to have their Pap test done at the same time as an HPV (human papillomavirus) test every 5 years (instead of every 3 years). Your health care provider may recommend having Pap tests more or less often depending on your medical conditions and past Pap test results. What kind of sample is taken?  Your health care provider will collect a sample of cells from the surface of your cervix. This will be done using a small cotton swab, plastic spatula, or brush. This sample is often collected during a pelvic exam, when you are lying on your back on an exam table with feet in footrests (stirrups). In some cases, fluids (secretions) from the cervix or vagina may also be collected. How do I prepare for this test?  Be aware of where you are in your menstrual cycle. If you are menstruating on the day of the test, you may be asked to reschedule.  You may need to reschedule if you have a known vaginal infection on the day of the test.  Follow instructions from your health care provider about: ? Changing or stopping your regular medicines. Some medicines can cause abnormal test results, such as digitalis and tetracycline. ? Avoiding douching or taking a bath the day before or the day of the test. Tell a health care provider about:  Any allergies you have.  All medicines you are taking, including vitamins, herbs, eye drops, creams, and over-the-counter medicines.  Any blood disorders you have.  Any surgeries you have had.  Any  medical conditions you have.  Whether you are pregnant or may be pregnant. How are the results reported? Your test results will be reported as either abnormal or normal. A false-positive result can occur. A false positive is incorrect because it means that a condition is present when it is not. A false-negative result can occur. A false negative is incorrect because it means that a condition is not present when it is. What do the results mean? A normal test result means that you do not have signs of cancer of the vagina, cervix, or uterus. An abnormal result may mean that you have:  Cancer. A Pap test by itself is not enough to diagnose cancer. You will have more tests done in this case.  Precancerous changes in your vagina, cervix, or uterus.  Inflammation of the cervix.  An STD (sexually transmitted disease).  A fungal infection.  A parasite infection. Talk with your health care provider about what your results mean. Questions to ask your health care provider Ask your health care provider, or the department that is doing the test:  When will my results be ready?  How will I get my results?  What are my treatment options?  What other tests do I need?  What are my next steps? Summary  In general, women should have a Pap test every 3 years until they reach menopause or age 26.  Your health care provider will collect a   sample of cells from the surface of your cervix. This will be done using a small cotton swab, plastic spatula, or brush.  In some cases, fluids (secretions) from the cervix or vagina may also be collected. This information is not intended to replace advice given to you by your health care provider. Make sure you discuss any questions you have with your health care provider. Document Revised: 12/01/2016 Document Reviewed: 12/01/2016 Elsevier Patient Education  [place]. Ethinyl Estradiol; Etonogestrel vaginal ring What is this medicine? ETHINYL  ESTRADIOL; ETONOGESTREL (ETH in il es tra DYE ole; et oh noe JES trel) vaginal ring is a flexible, vaginal ring used as a contraceptive (birth control method). This medicine combines 2 types of female hormones, an estrogen and a progestin. This ring is used to prevent ovulation and pregnancy. Each ring is effective for 1 month. This medicine may be used for other purposes; ask your health care provider or pharmacist if you have questions. COMMON BRAND NAME(S): EluRyng, NuvaRing What should I tell my health care provider before I take this medicine? They need to know if you have any of these conditions:  abnormal vaginal bleeding  blood vessel disease or blood clots  breast, cervical, endometrial, ovarian, liver, or uterine cancer  diabetes  gallbladder disease  having surgery  heart disease or recent heart attack  high blood pressure  high cholesterol or triglycerides  history of irregular heartbeat or heart valve problems  kidney disease  liver disease  migraine headaches  protein C deficiency  protein S deficiency  recently had a baby, miscarriage, or abortion  stroke  systemic lupus erythematosus (SLE)  tobacco smoker  your age is more than 26 years old  an unusual or allergic reaction to estrogens, progestins, other medicines, foods, dyes, or preservatives  pregnant or trying to get pregnant  breast-feeding How should I use this medicine? Insert the ring into your vagina as directed. Follow the directions on the prescription label. The ring will remain place for 3 weeks and is then removed for a 1-week break. A new ring is inserted 1 week after the last ring was removed, on the same day of the week. Check often to make sure the ring is still in place. If the ring was out of the vagina for an unknown amount of time, you may not be protected from pregnancy. Perform a pregnancy test and call your doctor. Do not use more often than directed. A patient package  insert for the product will be given with each prescription and refill. Read this sheet carefully each time. The sheet may change frequently. Contact your pediatrician regarding the use of this medicine in children. Special care may be needed. Overdosage: If you think you have taken too much of this medicine contact a poison control center or emergency room at once. NOTE: This medicine is only for you. Do not share this medicine with others. What if I miss a dose? You will need to use the ring exactly as directed. It is very important to follow the schedule every cycle. If you do not use the ring as directed, you may not be protected from pregnancy. If the ring should slip out, is lost, or if you leave it in longer or shorter than you should, contact your health care professional for advice. What may interact with this medicine? Do not take this medicine with the following medications:  dasabuvir; ombitasvir; paritaprevir; ritonavir  ombitasvir; paritaprevir; ritonavir  vaginal lubricants or other vaginal products that  are oil-based or silicone-based This medicine may also interact with the following medications:  acetaminophen  antibiotics or medicines for infections, especially rifampin, rifabutin, rifapentine, and griseofulvin, and possibly penicillins or tetracyclines  aprepitant or fosaprepitant  armodafinil  ascorbic acid (vitamin C)  barbiturate medicines, such as phenobarbital or primidone  bosentan  certain antiviral medicines for hepatitis, HIV or AIDS  certain medicines for cancer treatment  certain medicines for seizures like carbamazepine, clobazam, felbamate, lamotrigine, oxcarbazepine, phenytoin, rufinamide, topiramate  certain medicines for treating high cholesterol  cyclosporine  dantrolene  elagolix  flibanserin  grapefruit juice  lesinurad  medicines for diabetes  medicines to treat fungal infections, such as griseofulvin, miconazole,  fluconazole, ketoconazole, itraconazole, posaconazole or voriconazole  mifepristone  mitotane  modafinil  morphine  mycophenolate  St. John's wort  tamoxifen  temazepam  theophylline or aminophylline  thyroid hormones  tizanidine  tranexamic acid  ulipristal  warfarin This list may not describe all possible interactions. Give your health care provider a list of all the medicines, herbs, non-prescription drugs, or dietary supplements you use. Also tell them if you smoke, drink alcohol, or use illegal drugs. Some items may interact with your medicine. What should I watch for while using this medicine? Visit your doctor or health care professional for regular checks on your progress. You will need a regular breast and pelvic exam and Pap smear while on this medicine. Check with your doctor or health care professional to see if you need an additional method of contraception during the first cycle that you use this ring. Female condoms (made with natural rubber latex, polyisoprene, and polyurethane) and spermicides may be used. Do not use a diaphragm, cervical cap, or a female condom, as the ring can interfere with these birth control methods and their proper placement. If you have any reason to think you are pregnant, stop using this medicine right away and contact your doctor or health care professional. If you are using this medicine for hormone related problems, it may take several cycles of use to see improvement in your condition. Smoking increases the risk of getting a blood clot or having a stroke while you are using hormonal birth control, especially if you are more than 26 years old. You are strongly advised not to smoke. Some women are prone to getting dark patches on the skin of the face (cholasma). Your risk of getting chloasma with this medicine is higher if you had chloasma during a pregnancy. Keep out of the sun. If you cannot avoid being in the sun, wear protective  clothing and use sunscreen. Do not use sun lamps or tanning beds/booths. This medicine can make your body retain fluid, making your fingers, hands, or ankles swell. Your blood pressure can go up. Contact your doctor or health care professional if you feel you are retaining fluid. If you are going to have elective surgery, you may need to stop using this medicine before the surgery. Consult your health care professional for advice. This medicine does not protect you against HIV infection (AIDS) or any other sexually transmitted diseases. What side effects may I notice from receiving this medicine? Side effects that you should report to your doctor or health care professional as soon as possible:  allergic reactions such as skin rash or itching, hives, swelling of the lips, mouth, tongue, or throat  depression  high blood pressure  migraines or severe, sudden headaches  signs and symptoms of a blood clot such as breathing problems; changes in vision;   chest pain; severe, sudden headache; pain, swelling, warmth in the leg; trouble speaking; sudden numbness or weakness of the face, arm or leg  signs and symptoms of infection like fever or chills with dizziness and a sunburn-like rash, or pain or trouble passing urine  stomach pain  symptoms of vaginal infection like itching, irritation or unusual discharge  yellowing of the eyes or skin Side effects that usually do not require medical attention (report these to your doctor or health care professional if they continue or are bothersome):  acne  breast pain, tenderness  irregular vaginal bleeding or spotting, particularly during the first month of use  mild headache  nausea  painful periods  vomiting This list may not describe all possible side effects. Call your doctor for medical advice about side effects. You may report side effects to FDA at 1-800-FDA-1088. Where should I keep my medicine? Keep out of the reach of  children. Store unopened medicine for up to 4 months at room temperature at 15 and 30 degrees C (59 and 86 degrees F). Protect from light. Do not store above 30 degrees C (86 degrees F). Throw away any unused medicine 4 months after the dispense date or the expiration date, whichever comes first. A ring may only be used for 1 cycle (1 month). After the 3-week cycle, a used ring is removed and should be placed in the re-closable foil pouch and discarded in the trash out of reach of children and pets. Do NOT flush down the toilet. NOTE: This sheet is a summary. It may not cover all possible information. If you have questions about this medicine, talk to your doctor, pharmacist, or health care provider.  2020 Elsevier/Gold Standard (2018-10-14 12:31:47) Preventive Care 33-4 Years Old, Female Preventive care refers to visits with your health care provider and lifestyle choices that can promote health and wellness. This includes:  A yearly physical exam. This may also be called an annual well check.  Regular dental visits and eye exams.  Immunizations.  Screening for certain conditions.  Healthy lifestyle choices, such as eating a healthy diet, getting regular exercise, not using drugs or products that contain nicotine and tobacco, and limiting alcohol use. What can I expect for my preventive care visit? Physical exam Your health care provider will check your:  Height and weight. This may be used to calculate body mass index (BMI), which tells if you are at a healthy weight.  Heart rate and blood pressure.  Skin for abnormal spots. Counseling Your health care provider may ask you questions about your:  Alcohol, tobacco, and drug use.  Emotional well-being.  Home and relationship well-being.  Sexual activity.  Eating habits.  Work and work Statistician.  Method of birth control.  Menstrual cycle.  Pregnancy history. What immunizations do I need?  Influenza (flu)  vaccine  This is recommended every year. Tetanus, diphtheria, and pertussis (Tdap) vaccine  You may need a Td booster every 10 years. Varicella (chickenpox) vaccine  You may need this if you have not been vaccinated. Human papillomavirus (HPV) vaccine  If recommended by your health care provider, you may need three doses over 6 months. Measles, mumps, and rubella (MMR) vaccine  You may need at least one dose of MMR. You may also need a second dose. Meningococcal conjugate (MenACWY) vaccine  One dose is recommended if you are age 67-21 years and a first-year college student living in a residence hall, or if you have one of several medical conditions. You  may also need additional booster doses. Pneumococcal conjugate (PCV13) vaccine  You may need this if you have certain conditions and were not previously vaccinated. Pneumococcal polysaccharide (PPSV23) vaccine  You may need one or two doses if you smoke cigarettes or if you have certain conditions. Hepatitis A vaccine  You may need this if you have certain conditions or if you travel or work in places where you may be exposed to hepatitis A. Hepatitis B vaccine  You may need this if you have certain conditions or if you travel or work in places where you may be exposed to hepatitis B. Haemophilus influenzae type b (Hib) vaccine  You may need this if you have certain conditions. You may receive vaccines as individual doses or as more than one vaccine together in one shot (combination vaccines). Talk with your health care provider about the risks and benefits of combination vaccines. What tests do I need?  Blood tests  Lipid and cholesterol levels. These may be checked every 5 years starting at age 20.  Hepatitis C test.  Hepatitis B test. Screening  Diabetes screening. This is done by checking your blood sugar (glucose) after you have not eaten for a while (fasting).  Sexually transmitted disease (STD)  testing.  BRCA-related cancer screening. This may be done if you have a family history of breast, ovarian, tubal, or peritoneal cancers.  Pelvic exam and Pap test. This may be done every 3 years starting at age 21. Starting at age 30, this may be done every 5 years if you have a Pap test in combination with an HPV test. Talk with your health care provider about your test results, treatment options, and if necessary, the need for more tests. Follow these instructions at home: Eating and drinking   Eat a diet that includes fresh fruits and vegetables, whole grains, lean protein, and low-fat dairy.  Take vitamin and mineral supplements as recommended by your health care provider.  Do not drink alcohol if: ? Your health care provider tells you not to drink. ? You are pregnant, may be pregnant, or are planning to become pregnant.  If you drink alcohol: ? Limit how much you have to 0-1 drink a day. ? Be aware of how much alcohol is in your drink. In the [place] one drink equals one 12 oz bottle of beer (355 mL), one 5 oz glass of wine (148 mL), or one 1 oz glass of hard liquor (44 mL). Lifestyle  Take daily care of your teeth and gums.  Stay active. Exercise for at least 30 minutes on 5 or more days each week.  Do not use any products that contain nicotine or tobacco, such as cigarettes, e-cigarettes, and chewing tobacco. If you need help quitting, ask your health care provider.  If you are sexually active, practice safe sex. Use a condom or other form of birth control (contraception) in order to prevent pregnancy and STIs (sexually transmitted infections). If you plan to become pregnant, see your health care provider for a preconception visit. What's next?  Visit your health care provider once a year for a well check visit.  Ask your health care provider how often you should have your eyes and teeth checked.  Stay up to date on all vaccines. This information is not intended to replace  advice given to you by your health care provider. Make sure you discuss any questions you have with your health care provider. Document Revised: 12/03/2017 Document Reviewed: 12/03/2017 Elsevier Patient Education    [place].

## 2019-07-07 NOTE — Progress Notes (Signed)
ANNUAL PREVENTATIVE CARE GYN  ENCOUNTER NOTE  Subjective:       Olivia Gillespie is a 24 y.o. G0P0000 female here for a routine annual gynecologic exam.  Current complaints: 1. Needs NuvaRing refill  No other questions or concerns. Denies difficulty breathing or respiratory distress, chest pain, abdominal pain, excessive vaginal bleeding, dysuria, and leg pain or swelling.     Gynecologic History  No LMP recorded.  Contraception: NuvaRing vaginal inserts  Last Pap: 04/2017. Results were: normal  Obstetric History  OB History  Gravida Para Term Preterm AB Living  0 0 0 0 0 0  SAB TAB Ectopic Multiple Live Births  0 0 0 0 0    Past Medical History:  Diagnosis Date  . Acne   . Bacterial vaginitis   . Depression   . Insomnia   . PCOS (polycystic ovarian syndrome)   . Seasonal allergies     Past Surgical History:  Procedure Laterality Date  . WISDOM TOOTH EXTRACTION  2013    Current Outpatient Medications on File Prior to Visit  Medication Sig Dispense Refill  . buPROPion (WELLBUTRIN SR) 150 MG 12 hr tablet Take 150 mg by mouth 2 (two) times daily.    . Melatonin 3 MG CAPS Take 3 mg by mouth at bedtime.    . propranolol (INDERAL) 20 MG tablet Take 20 mg by mouth as needed.    Marland Kitchen spironolactone (ALDACTONE) 100 MG tablet TAKE 2 TABLETS BY MOUTH DIALY 60 tablet 0   No current facility-administered medications on file prior to visit.    No Known Allergies  Social History   Socioeconomic History  . Marital status: Single    Spouse name: Not on file  . Number of children: Not on file  . Years of education: Not on file  . Highest education level: Not on file  Occupational History  . Not on file  Tobacco Use  . Smoking status: Never Smoker  . Smokeless tobacco: Never Used  Substance and Sexual Activity  . Alcohol use: No  . Drug use: No  . Sexual activity: Yes    Birth control/protection: Other-see comments    Comment: Nuvaring  Other Topics Concern  . Not on  file  Social History Narrative  . Not on file   Social Determinants of Health   Financial Resource Strain:   . Difficulty of Paying Living Expenses:   Food Insecurity:   . Worried About Charity fundraiser in the Last Year:   . Arboriculturist in the Last Year:   Transportation Needs:   . Film/video editor (Medical):   Marland Kitchen Lack of Transportation (Non-Medical):   Physical Activity:   . Days of Exercise per Week:   . Minutes of Exercise per Session:   Stress:   . Feeling of Stress :   Social Connections:   . Frequency of Communication with Friends and Family:   . Frequency of Social Gatherings with Friends and Family:   . Attends Religious Services:   . Active Member of Clubs or Organizations:   . Attends Archivist Meetings:   Marland Kitchen Marital Status:   Intimate Partner Violence:   . Fear of Current or Ex-Partner:   . Emotionally Abused:   Marland Kitchen Physically Abused:   . Sexually Abused:     Family History  Problem Relation Age of Onset  . Hypertension Mother   . Mental illness Maternal Grandmother   . Hyperlipidemia Paternal Grandfather   . Diabetes  Paternal Grandfather   . Breast cancer Neg Hx   . Ovarian cancer Neg Hx   . Colon cancer Neg Hx     The following portions of the patient's history were reviewed and updated as appropriate: allergies, current medications, past family history, past medical history, past social history, past surgical history and problem list.  Review of Systems  ROS negative except as noted above. Information obtained from patient.    Objective:   BP 110/74   Pulse 90   Ht 5\' 6"  (1.676 m)   Wt 155 lb 9.6 oz (70.6 kg)   BMI 25.11 kg/m    CONSTITUTIONAL: Well-developed, well-nourished female in no acute distress.   PSYCHIATRIC: Normal mood and affect. Normal behavior. Normal judgment and thought content.  NEUROLGIC: Alert and oriented to person, place, and time. Normal muscle tone coordination. No cranial nerve deficit  noted.  HENT:  Normocephalic, atraumatic, External right and left ear normal.  EYES: Conjunctivae and EOM are normal. Pupils are equal and round.   NECK: Normal range of motion, supple, no masses.  Normal thyroid.   SKIN: Skin is warm and dry. No rash noted. Not diaphoretic. No erythema. No pallor.  CARDIOVASCULAR: Normal heart rate noted, regular rhythm, no murmur.  RESPIRATORY: Clear to auscultation bilaterally. Effort and breath sounds normal, no problems with respiration noted.  BREASTS: Symmetric in size. No masses, skin changes, nipple drainage, or lymphadenopathy.  ABDOMEN: Soft, normal bowel sounds, no distention noted.  No tenderness, rebound or guarding.   PELVIC:  External Genitalia: Normal  Vagina: Normal  Cervix: Normal  Uterus: Normal  Adnexa: Normal  MUSCULOSKELETAL: Normal range of motion. No tenderness.  No cyanosis, clubbing, or edema.  2+ distal pulses.  LYMPHATIC: No Axillary, Supraclavicular, or Inguinal Adenopathy.  Assessment:   Annual gynecologic examination 24 y.o.   Contraception: NuvaRing vaginal inserts   Normal BMI   Problem List Items Addressed This Visit    None    Visit Diagnoses    Well woman exam    -  Primary   Encounter for surveillance of vaginal ring hormonal contraceptive device       History of PCOS          Plan:   Pap: Not needed  Labs: Managed by PCP  Routine preventative health maintenance measures emphasized: Exercise/Diet/Weight control, Tobacco Warnings, Alcohol/Substance use risks and Stress Management; see AVS  Rx NuvaRing, see orders  Reviewed red flag symptoms and when to call  Return to Clinic - 1 Year for 30 and Pap or sooner if needed   [employer], CNM [facility], [facility] 07/07/19 3:40 PM

## 2019-07-21 ENCOUNTER — Other Ambulatory Visit: Payer: Self-pay | Admitting: Certified Nurse Midwife

## 2019-07-27 ENCOUNTER — Other Ambulatory Visit: Payer: Self-pay | Admitting: Certified Nurse Midwife

## 2020-02-13 ENCOUNTER — Other Ambulatory Visit: Payer: Self-pay | Admitting: Student

## 2020-02-13 DIAGNOSIS — R1011 Right upper quadrant pain: Secondary | ICD-10-CM

## 2020-02-16 ENCOUNTER — Ambulatory Visit
Admission: RE | Admit: 2020-02-16 | Discharge: 2020-02-16 | Disposition: A | Discharge status: Home / Self Care | Payer: BC Managed Care – PPO | Source: Ambulatory Visit | Attending: Student | Admitting: Student

## 2020-02-16 ENCOUNTER — Other Ambulatory Visit: Payer: Self-pay

## 2020-02-16 DIAGNOSIS — R1011 Right upper quadrant pain: Secondary | ICD-10-CM | POA: Diagnosis present

## 2020-02-28 ENCOUNTER — Ambulatory Visit: Payer: Self-pay | Admitting: Surgery

## 2020-02-28 NOTE — H&P (View-Only) (Signed)
Subjective:   CC: Biliary colic [K80.50]  HPI:  Olivia Gillespie is a 24 y.o. female who was referred by Glorious Peach, PA for evaluation of above CC. Symptoms were first noted several years ago. Pain is sharp and intermittent, confined to the epigastric area, without radiation.  Associated with nothing specific, exacerbated by greasy food, decreased since avoiding them.  Had recent exacerbation with radiation to RUQ.  PPI, carafate did provide some relief afterwards.     Past Medical History:  has a past medical history of Allergic state, Anxiety, Depression, Gastric ulcer, GERD (gastroesophageal reflux disease), Headache, unspecified headache type, Hyperlipidemia (December 2017), and PCOS (polycystic ovarian syndrome).  Past Surgical History:  has a past surgical history that includes Wisdom Teeth Removal (2013).  Family History: family history includes Anxiety in her maternal grandmother and mother; Asperger's syndrome in her brother; Depression in her maternal grandmother and mother; Diabetes type II in her paternal grandfather; High blood pressure (Hypertension) in her father and mother; Hyperlipidemia (Elevated cholesterol) in her father, mother, and paternal grandfather; Mental illness in her maternal grandmother; No Known Problems in her paternal grandmother; Obesity in her father, maternal grandmother, mother, and paternal grandfather; Polycystic ovary syndrome in her mother.  Social History:  reports that she has never smoked. She has never used smokeless tobacco. She reports that she does not drink alcohol and does not use drugs.  Current Medications: has a current medication list which includes the following prescription(s): bupropion, etonogestrel-ethinyl estradiol, fluticasone propionate, loratadine, melatonin, pantoprazole, propranolol, spironolactone, and sucralfate.  Allergies:  Allergies as of 02/28/2020   (No Known Allergies)    ROS:  A 15 point review of systems was  performed and pertinent positives and negatives noted in HPI    Objective:     BP (!) 131/91    Pulse 103    Ht 165.1 cm (5\' 5" )    Wt 67.6 kg (149 lb)    BMI 24.79 kg/m    Constitutional :  alert, appears stated age, cooperative and no distress  Lymphatics/Throat:  no asymmetry, masses, or scars  Respiratory:  clear to auscultation bilaterally  Cardiovascular:  regular rate and rhythm  Gastrointestinal: soft, no guarding, complains of minor TTP in epigastric, slightly in RUQ. some LLQ pain as well..    Musculoskeletal: Steady gait and movement  Skin: Cool and moist  Psychiatric: Normal affect, non-agitated, not confused       LABS:  n/a   RADS: CLINICAL DATA: Right upper quadrant pain.   EXAM:  ULTRASOUND ABDOMEN LIMITED RIGHT UPPER QUADRANT   COMPARISON: None.   FINDINGS:  Gallbladder:   No wall thickening visualized. Mobile intraluminal stones measuring  up to 9 mm with sludge. No sonographic Murphy sign noted by  sonographer.   Common bile duct:   Diameter: 3.2 mm   Liver:   No focal lesion identified. Within normal limits in parenchymal  echogenicity. Portal vein is patent on color Doppler imaging with  normal direction of blood flow towards the liver.   Other: None.   IMPRESSION:  Cholelithiasis without cholecystitis.    Electronically Signed  By: M.D.  On: 02/16/2020 16:19  Assessment:      Biliary colic [K80.50]  Constipation- chronic LLQ pain for some time.  Hard stools every three days.  Likely not related to the recent biliary colic episodes.  Plan:     1. Biliary colic [K80.50]   Discussed the risk of surgery including post-op infxn, seroma, biloma, chronic pain,  poor-delayed wound healing, retained gallstone, conversion to open procedure, post-op SBO or ileus, and need for additional procedures to address said risks.  The risks of general anesthetic including MI, CVA, sudden death or even reaction to anesthetic  medications also discussed. Alternatives include continued observation.  Benefits include possible symptom relief, prevention of complications including acute cholecystitis, pancreatitis.  Typical post operative recovery of 3-5 days rest, continued pain in area and incision sites, possible loose stools up to 4-6 weeks, also discussed.  ED return precautions given for sudden increase in RUQ pain, with possible accompanying fever, nausea, and/or vomiting.  The patient understands the risks, any and all questions were answered to the patient's satisfaction.  2. Patient has elected to proceed with surgical treatment. Procedure will be scheduled.  Written consent was obtained.robotic assisted laparoscopic.  carafate relieving symptoms is not typical history, but patient still wishes to proceed with surgery.  No guarantee of pain relief at this point.

## 2020-02-28 NOTE — H&P (Signed)
Subjective:  ° °CC: Biliary colic [K80.50] ° °HPI: ° Olivia Gillespie is a 24 y.o. female who was referred by Danielle Ann Carter, PA for evaluation of above CC. Symptoms were first noted several years ago. Pain is sharp and intermittent, confined to the epigastric area, without radiation.  Associated with nothing specific, exacerbated by greasy food, decreased since avoiding them.  Had recent exacerbation with radiation to RUQ.  °PPI, carafate did provide some relief afterwards. °    °Past Medical History:  has a past medical history of Allergic state, Anxiety, Depression, Gastric ulcer, GERD (gastroesophageal reflux disease), Headache, unspecified headache type, Hyperlipidemia (December 2017), and PCOS (polycystic ovarian syndrome). ° °Past Surgical History:  has a past surgical history that includes Wisdom Teeth Removal (2013). ° °Family History: family history includes Anxiety in her maternal grandmother and mother; Asperger's syndrome in her brother; Depression in her maternal grandmother and mother; Diabetes type II in her paternal grandfather; High blood pressure (Hypertension) in her father and mother; Hyperlipidemia (Elevated cholesterol) in her father, mother, and paternal grandfather; Mental illness in her maternal grandmother; No Known Problems in her paternal grandmother; Obesity in her father, maternal grandmother, mother, and paternal grandfather; Polycystic ovary syndrome in her mother. ° °Social History:  reports that she has never smoked. She has never used smokeless tobacco. She reports that she does not drink alcohol and does not use drugs. ° °Current Medications: has a current medication list which includes the following prescription(s): bupropion, etonogestrel-ethinyl estradiol, fluticasone propionate, loratadine, melatonin, pantoprazole, propranolol, spironolactone, and sucralfate. ° °Allergies:  °Allergies as of 02/28/2020  °• (No Known Allergies)  ° ° °ROS:  °A 15 point review of systems was  performed and pertinent positives and negatives noted in HPI  °  °Objective:  °  ° °BP (!) 131/91    Pulse 103    Ht 165.1 cm (5' 5")    Wt 67.6 kg (149 lb)    BMI 24.79 kg/m²  ° ° °Constitutional :  alert, appears stated age, cooperative and no distress  °Lymphatics/Throat:  no asymmetry, masses, or scars  °Respiratory:  clear to auscultation bilaterally  °Cardiovascular:  regular rate and rhythm  °Gastrointestinal: soft, no guarding, complains of minor TTP in epigastric, slightly in RUQ. some LLQ pain as well..    °Musculoskeletal: Steady gait and movement  °Skin: Cool and moist  °Psychiatric: Normal affect, non-agitated, not confused  °   °  °LABS:  °n/a  ° °RADS: °CLINICAL DATA:  Right upper quadrant pain.  ° °EXAM:  °ULTRASOUND ABDOMEN LIMITED RIGHT UPPER QUADRANT  ° °COMPARISON:  None.  ° °FINDINGS:  °Gallbladder:  ° °No wall thickening visualized. Mobile intraluminal stones measuring  °up to 9 mm with sludge. No sonographic Murphy sign noted by  °sonographer.  ° °Common bile duct:  ° °Diameter: 3.2 mm  ° °Liver:  ° °No focal lesion identified. Within normal limits in parenchymal  °echogenicity. Portal vein is patent on color Doppler imaging with  °normal direction of blood flow towards the liver.  ° °Other: None.  ° °IMPRESSION:  °Cholelithiasis without cholecystitis.  ° ° °Electronically Signed  °  By: Chikanele  Emekauwa M.D.  °  On: 02/16/2020 16:19 ° °Assessment:  ° °   °Biliary colic [K80.50]  °Constipation- chronic LLQ pain for some time.  Hard stools every three days.  Likely not related to the recent biliary colic episodes. ° °Plan:  ° °  °1. Biliary colic [K80.50]  ° °Discussed the risk of surgery including post-op infxn, seroma, biloma, chronic pain,   poor-delayed wound healing, retained gallstone, conversion to open procedure, post-op SBO or ileus, and need for additional procedures to address said risks.  The risks of general anesthetic including MI, CVA, sudden death or even reaction to anesthetic  medications also discussed. Alternatives include continued observation.  Benefits include possible symptom relief, prevention of complications including acute cholecystitis, pancreatitis. ° °Typical post operative recovery of 3-5 days rest, continued pain in area and incision sites, possible loose stools up to 4-6 weeks, also discussed. ° °ED return precautions given for sudden increase in RUQ pain, with possible accompanying fever, nausea, and/or vomiting. ° °The patient understands the risks, any and all questions were answered to the patient's satisfaction. ° °2. Patient has elected to proceed with surgical treatment. Procedure will be scheduled.  Written consent was obtained.robotic assisted laparoscopic.  carafate relieving symptoms is not typical history, but patient still wishes to proceed with surgery.  No guarantee of pain relief at this point. ° ° °

## 2020-03-05 ENCOUNTER — Encounter
Admission: RE | Admit: 2020-03-05 | Discharge: 2020-03-05 | Disposition: A | Discharge status: Home / Self Care | Payer: BC Managed Care – PPO | Source: Ambulatory Visit | Attending: Surgery | Admitting: Surgery

## 2020-03-05 ENCOUNTER — Other Ambulatory Visit: Payer: Self-pay

## 2020-03-05 HISTORY — DX: Gastro-esophageal reflux disease without esophagitis: K21.9

## 2020-03-05 HISTORY — DX: Anxiety disorder, unspecified: F41.9

## 2020-03-05 HISTORY — DX: Headache, unspecified: R51.9

## 2020-03-05 HISTORY — DX: Hyperlipidemia, unspecified: E78.5

## 2020-03-05 NOTE — Patient Instructions (Signed)
Your procedure is scheduled on: Thursday March 08, 2020 Report to the Registration Desk on the 1st floor of the [facility] To find out your arrival time, please call [phone] between 1PM - 3PM on: Wednesday March 07, 2020  REMEMBER: Instructions that are not followed completely may result in serious medical risk, up to and including death; or upon the discretion of your surgeon and anesthesiologist your surgery may need to be rescheduled.  Do not eat food after midnight the night before surgery.  No gum chewing, lozengers or hard candies.  You may however, drink CLEAR liquids up to 2 hours before you are scheduled to arrive for your surgery. Do not drink anything within 2 hours of your scheduled arrival time.  Clear liquids include: - water  - apple juice without pulp - gatorade (not RED, PURPLE, OR BLUE) - black coffee or tea (Do NOT add milk or creamers to the coffee or tea) Do NOT drink anything that is not on this list.  Type 1 and Type 2 diabetics should only drink water.  TAKE THESE MEDICATIONS THE MORNING OF SURGERY WITH A SIP OF WATER: BUPROPION LORATADINE PROPRANOLOL  USE FLONASE  Follow recommendations from Cardiologist, Pulmonologist or PCP regarding stopping  ASPIRIN OR Aspirin, Coumadin, Plavix, Eliquis, Pradaxa, or Pletal.  One week prior to surgery: Stop Anti-inflammatories (NSAIDS) such as Advil, Aleve, Ibuprofen, Motrin, Naproxen, Naprosyn and Aspirin based products such as Excedrin, Goodys Powder, BC Powder.    YOU MAY USE TYLENOL Stop ANY OVER THE COUNTER supplements until after surgery. STOP MELATONIN (However, you may continue taking Vitamin D, Vitamin B, and multivitamin up until the day before surgery.)  No Alcohol for 24 hours before or after surgery.  No Smoking including e-cigarettes for 24 hours prior to surgery.  No chewable tobacco products for at least 6 hours prior to surgery.  No nicotine patches on the day of surgery.  Do not use  any "recreational" drugs for at least a week prior to your surgery.  Please be advised that the combination of cocaine and anesthesia may have negative outcomes, up to and including death. If you test positive for cocaine, your surgery will be cancelled.  On the morning of surgery brush your teeth with toothpaste and water, you may rinse your mouth with mouthwash if you wish. Do not swallow any toothpaste or mouthwash.  Do not wear jewelry, make-up, hairpins, clips or nail polish.  Do not wear lotions, powders, or perfumes OR DEODORANT  Do not shave body from the neck down 48 hours prior to surgery just in case you cut yourself which could leave a site for infection.  Also, freshly shaved skin may become irritated if using the CHG soap.  Contact lenses, hearing aids and dentures may not be worn into surgery.  Do not bring valuables to the hospital. [facility] is not responsible for any missing/lost belongings or valuables.   Use CHG Soap as directed on instruction sheet.  Notify your doctor if there is any change in your medical condition (cold, fever, infection).  Wear comfortable clothing (specific to your surgery type) to the hospital.  Plan for stool softeners for home use; pain medications have a tendency to cause constipation. You can also help prevent constipation by eating foods high in fiber such as fruits and vegetables and drinking plenty of fluids as your diet allows.  After surgery, you can help prevent lung complications by doing breathing exercises.  Take deep breaths and cough every 1-2  hours. Your doctor may order a device called an Incentive Spirometer to help you take deep breaths. When coughing or sneezing, hold a pillow firmly against your incision with both hands. This is called "splinting." Doing this helps protect your incision. It also decreases belly discomfort.  If you are being discharged the day of surgery, you will not be allowed to drive home. You will  need a responsible adult (18 years or older) to drive you home and stay with you that night.   Please call the Pre-admissions Testing Dept. at [phone] if you have any questions about these instructions.  Visitation Policy:  Patients undergoing a surgery or procedure may have one family member or support person with them as long as that person is not COVID-19 positive or experiencing its symptoms.  That person may remain in the waiting area during the procedure.  Inpatient Visitation Update:   In an effort to ensure the safety of our team members and our patients, we are implementing a change to our visitation policy:  Effective Monday, Aug. 9, at 7 a.m., inpatients will be allowed one support person.  o The support person may change daily.  o The support person must pass our screening, gel in and out, and wear a mask at all times, including in the patient's room.  o Patients must also wear a mask when staff or their support person are in the room.  o Masking is required regardless of vaccination status.  Systemwide, no visitors 17 or younger.

## 2020-03-06 ENCOUNTER — Other Ambulatory Visit
Admission: RE | Admit: 2020-03-06 | Discharge: 2020-03-06 | Disposition: A | Discharge status: Home / Self Care | Payer: BC Managed Care – PPO | Source: Ambulatory Visit | Attending: Surgery | Admitting: Surgery

## 2020-03-06 DIAGNOSIS — K8050 Calculus of bile duct without cholangitis or cholecystitis without obstruction: Secondary | ICD-10-CM | POA: Diagnosis present

## 2020-03-06 DIAGNOSIS — Z01812 Encounter for preprocedural laboratory examination: Secondary | ICD-10-CM | POA: Insufficient documentation

## 2020-03-06 DIAGNOSIS — Z20822 Contact with and (suspected) exposure to covid-19: Secondary | ICD-10-CM | POA: Insufficient documentation

## 2020-03-06 DIAGNOSIS — Z79899 Other long term (current) drug therapy: Secondary | ICD-10-CM | POA: Diagnosis not present

## 2020-03-06 DIAGNOSIS — K8064 Calculus of gallbladder and bile duct with chronic cholecystitis without obstruction: Secondary | ICD-10-CM | POA: Diagnosis not present

## 2020-03-06 LAB — SARS CORONAVIRUS 2 (TAT 6-24 HRS): SARS Coronavirus 2: NEGATIVE

## 2020-03-07 MED ORDER — FAMOTIDINE 20 MG PO TABS: 20.0000 mg | ORAL_TABLET | Freq: Once | ORAL | Status: DC

## 2020-03-07 MED ORDER — CEFAZOLIN SODIUM-DEXTROSE 2-4 GM/100ML-% IV SOLN
2.0000 g | INTRAVENOUS | Status: AC
  Administered 2020-03-08: 2 g via INTRAVENOUS

## 2020-03-07 MED ORDER — LACTATED RINGERS IV SOLN: INTRAVENOUS | Status: DC

## 2020-03-07 MED ORDER — INDOCYANINE GREEN 25 MG IV SOLR
1.2500 mg | Freq: Once | INTRAVENOUS | Status: AC
  Administered 2020-03-08: 1.25 mg via INTRAVENOUS
  Filled 2020-03-07: qty 0.5

## 2020-03-07 MED ORDER — ORAL CARE MOUTH RINSE: 15.0000 mL | Freq: Once | OROMUCOSAL | Status: AC

## 2020-03-07 MED ORDER — CHLORHEXIDINE GLUCONATE 0.12 % MT SOLN: 15.0000 mL | Freq: Once | OROMUCOSAL | Status: AC

## 2020-03-08 ENCOUNTER — Other Ambulatory Visit: Payer: Self-pay

## 2020-03-08 ENCOUNTER — Ambulatory Visit: Payer: BC Managed Care – PPO | Admitting: Anesthesiology

## 2020-03-08 ENCOUNTER — Encounter: Payer: Self-pay | Admitting: Surgery

## 2020-03-08 ENCOUNTER — Encounter
Admission: RE | Disposition: A | Discharge status: Home / Self Care | Payer: Self-pay | Source: Home / Self Care | Attending: Surgery

## 2020-03-08 ENCOUNTER — Ambulatory Visit
Admission: RE | Admit: 2020-03-08 | Discharge: 2020-03-08 | Disposition: A | Discharge status: Home / Self Care | Payer: BC Managed Care – PPO | Attending: Surgery | Admitting: Surgery

## 2020-03-08 DIAGNOSIS — K811 Chronic cholecystitis: Secondary | ICD-10-CM

## 2020-03-08 DIAGNOSIS — E282 Polycystic ovarian syndrome: Secondary | ICD-10-CM

## 2020-03-08 DIAGNOSIS — Z79899 Other long term (current) drug therapy: Secondary | ICD-10-CM | POA: Insufficient documentation

## 2020-03-08 DIAGNOSIS — K8064 Calculus of gallbladder and bile duct with chronic cholecystitis without obstruction: Secondary | ICD-10-CM | POA: Diagnosis not present

## 2020-03-08 DIAGNOSIS — Z20822 Contact with and (suspected) exposure to covid-19: Secondary | ICD-10-CM | POA: Insufficient documentation

## 2020-03-08 LAB — POCT I-STAT, CHEM 8
BUN: 11 mg/dL (ref 6–20)
Calcium, Ion: 1.3 mmol/L (ref 1.15–1.40)
Chloride: 104 mmol/L (ref 98–111)
Creatinine, Ser: 0.8 mg/dL (ref 0.44–1.00)
Glucose, Bld: 90 mg/dL (ref 70–99)
HCT: 42 % (ref 36.0–46.0)
Hemoglobin: 14.3 g/dL (ref 12.0–15.0)
Potassium: 4.1 mmol/L (ref 3.5–5.1)
Sodium: 138 mmol/L (ref 135–145)
TCO2: 24 mmol/L (ref 22–32)

## 2020-03-08 LAB — POCT URINE PREGNANCY: Preg Test, Ur: NEGATIVE

## 2020-03-08 SURGERY — CHOLECYSTECTOMY, ROBOT-ASSISTED, LAPAROSCOPIC
Anesthesia: General | Site: Abdomen

## 2020-03-08 MED ORDER — MIDAZOLAM HCL 2 MG/2ML IJ SOLN
INTRAMUSCULAR | Status: DC | PRN
  Administered 2020-03-08: 2 mg via INTRAVENOUS

## 2020-03-08 MED ORDER — FENTANYL CITRATE (PF) 100 MCG/2ML IJ SOLN
INTRAMUSCULAR | Status: DC | PRN
  Administered 2020-03-08: 100 ug via INTRAVENOUS

## 2020-03-08 MED ORDER — PROPOFOL 10 MG/ML IV BOLUS
INTRAVENOUS | Status: DC | PRN
  Administered 2020-03-08: 130 mg via INTRAVENOUS

## 2020-03-08 MED ORDER — DEXAMETHASONE SODIUM PHOSPHATE 10 MG/ML IJ SOLN
INTRAMUSCULAR | Status: AC
  Filled 2020-03-08: qty 1

## 2020-03-08 MED ORDER — DEXAMETHASONE SODIUM PHOSPHATE 10 MG/ML IJ SOLN
INTRAMUSCULAR | Status: DC | PRN
  Administered 2020-03-08: 10 mg via INTRAVENOUS

## 2020-03-08 MED ORDER — FENTANYL CITRATE (PF) 100 MCG/2ML IJ SOLN
INTRAMUSCULAR | Status: AC
  Filled 2020-03-08: qty 2

## 2020-03-08 MED ORDER — MEPERIDINE HCL 50 MG/ML IJ SOLN: 6.2500 mg | INTRAMUSCULAR | Status: DC | PRN

## 2020-03-08 MED ORDER — MIDAZOLAM HCL 2 MG/2ML IJ SOLN
INTRAMUSCULAR | Status: AC
  Filled 2020-03-08: qty 2

## 2020-03-08 MED ORDER — LIDOCAINE HCL (CARDIAC) PF 100 MG/5ML IV SOSY
PREFILLED_SYRINGE | INTRAVENOUS | Status: DC | PRN
  Administered 2020-03-08: 600 mg via INTRAVENOUS

## 2020-03-08 MED ORDER — ONDANSETRON HCL 4 MG/2ML IJ SOLN
INTRAMUSCULAR | Status: AC
  Filled 2020-03-08: qty 2

## 2020-03-08 MED ORDER — PHENYLEPHRINE HCL (PRESSORS) 10 MG/ML IV SOLN
INTRAVENOUS | Status: DC | PRN
  Administered 2020-03-08 (×6): 100 ug via INTRAVENOUS

## 2020-03-08 MED ORDER — DOCUSATE SODIUM 100 MG PO CAPS: 100.0000 mg | ORAL_CAPSULE | Freq: Two times a day (BID) | ORAL | 0 refills | Status: AC | PRN

## 2020-03-08 MED ORDER — IBUPROFEN 800 MG PO TABS: 800.0000 mg | ORAL_TABLET | Freq: Three times a day (TID) | ORAL | 0 refills | Status: AC | PRN

## 2020-03-08 MED ORDER — ACETAMINOPHEN 10 MG/ML IV SOLN
INTRAVENOUS | Status: DC | PRN
  Administered 2020-03-08: 1000 mg via INTRAVENOUS

## 2020-03-08 MED ORDER — OXYCODONE HCL 5 MG PO TABS
ORAL_TABLET | ORAL | Status: AC
  Administered 2020-03-08: 5 mg via ORAL
  Filled 2020-03-08: qty 1

## 2020-03-08 MED ORDER — DEXMEDETOMIDINE (PRECEDEX) IN NS 20 MCG/5ML (4 MCG/ML) IV SYRINGE
PREFILLED_SYRINGE | INTRAVENOUS | Status: AC
  Filled 2020-03-08: qty 5

## 2020-03-08 MED ORDER — PROPOFOL 10 MG/ML IV BOLUS
INTRAVENOUS | Status: AC
  Filled 2020-03-08: qty 20

## 2020-03-08 MED ORDER — LORAZEPAM 2 MG/ML IJ SOLN: 1.0000 mg | Freq: Once | INTRAMUSCULAR | Status: DC | PRN

## 2020-03-08 MED ORDER — ROCURONIUM BROMIDE 10 MG/ML (PF) SYRINGE
PREFILLED_SYRINGE | INTRAVENOUS | Status: AC
  Filled 2020-03-08: qty 10

## 2020-03-08 MED ORDER — PROMETHAZINE HCL 25 MG/ML IJ SOLN: 6.2500 mg | INTRAMUSCULAR | Status: DC | PRN

## 2020-03-08 MED ORDER — DEXMEDETOMIDINE (PRECEDEX) IN NS 20 MCG/5ML (4 MCG/ML) IV SYRINGE
PREFILLED_SYRINGE | INTRAVENOUS | Status: DC | PRN
  Administered 2020-03-08 (×3): 4 ug via INTRAVENOUS

## 2020-03-08 MED ORDER — FAMOTIDINE 20 MG PO TABS
ORAL_TABLET | ORAL | Status: AC
  Filled 2020-03-08: qty 1

## 2020-03-08 MED ORDER — ROCURONIUM BROMIDE 100 MG/10ML IV SOLN
INTRAVENOUS | Status: DC | PRN
  Administered 2020-03-08: 50 mg via INTRAVENOUS

## 2020-03-08 MED ORDER — LIDOCAINE-EPINEPHRINE (PF) 1 %-1:200000 IJ SOLN
INTRAMUSCULAR | Status: DC | PRN
  Administered 2020-03-08: 17 mL

## 2020-03-08 MED ORDER — DROPERIDOL 2.5 MG/ML IJ SOLN
0.6250 mg | Freq: Once | INTRAMUSCULAR | Status: DC | PRN
  Filled 2020-03-08: qty 2

## 2020-03-08 MED ORDER — HYDROMORPHONE HCL 1 MG/ML IJ SOLN
0.2500 mg | INTRAMUSCULAR | Status: DC | PRN
  Administered 2020-03-08 (×2): 0.25 mg via INTRAVENOUS
  Administered 2020-03-08: 0.5 mg via INTRAVENOUS
  Administered 2020-03-08: 0.25 mg via INTRAVENOUS

## 2020-03-08 MED ORDER — OXYCODONE HCL 5 MG PO TABS: 5.0000 mg | ORAL_TABLET | Freq: Once | ORAL | Status: AC | PRN

## 2020-03-08 MED ORDER — BUPIVACAINE HCL (PF) 0.5 % IJ SOLN
INTRAMUSCULAR | Status: AC
  Filled 2020-03-08: qty 30

## 2020-03-08 MED ORDER — SUGAMMADEX SODIUM 200 MG/2ML IV SOLN
INTRAVENOUS | Status: DC | PRN
  Administered 2020-03-08: 150 mg via INTRAVENOUS

## 2020-03-08 MED ORDER — CHLORHEXIDINE GLUCONATE 0.12 % MT SOLN
OROMUCOSAL | Status: AC
  Administered 2020-03-08: 15 mL via OROMUCOSAL
  Filled 2020-03-08: qty 15

## 2020-03-08 MED ORDER — HYDROCODONE-ACETAMINOPHEN 5-325 MG PO TABS: 1.0000 | ORAL_TABLET | Freq: Four times a day (QID) | ORAL | 0 refills | Status: DC | PRN

## 2020-03-08 MED ORDER — ONDANSETRON HCL 4 MG/2ML IJ SOLN
INTRAMUSCULAR | Status: DC | PRN
  Administered 2020-03-08: 4 mg via INTRAVENOUS

## 2020-03-08 MED ORDER — HYDROMORPHONE HCL 1 MG/ML IJ SOLN
INTRAMUSCULAR | Status: AC
  Administered 2020-03-08: 0.25 mg via INTRAVENOUS
  Filled 2020-03-08: qty 1

## 2020-03-08 MED ORDER — CHLORHEXIDINE GLUCONATE CLOTH 2 % EX PADS: 6.0000 | MEDICATED_PAD | Freq: Once | CUTANEOUS | Status: DC

## 2020-03-08 MED ORDER — BUPIVACAINE HCL (PF) 0.5 % IJ SOLN
INTRAMUSCULAR | Status: DC | PRN
  Administered 2020-03-08: 17 mL

## 2020-03-08 MED ORDER — HYDROMORPHONE HCL 1 MG/ML IJ SOLN
INTRAMUSCULAR | Status: AC
  Administered 2020-03-08: 0.5 mg via INTRAVENOUS
  Filled 2020-03-08: qty 1

## 2020-03-08 MED ORDER — OXYCODONE HCL 5 MG/5ML PO SOLN: 5.0000 mg | Freq: Once | ORAL | Status: AC | PRN

## 2020-03-08 MED ORDER — CEFAZOLIN SODIUM-DEXTROSE 2-4 GM/100ML-% IV SOLN
INTRAVENOUS | Status: AC
  Filled 2020-03-08: qty 100

## 2020-03-08 MED ORDER — LIDOCAINE-EPINEPHRINE (PF) 1 %-1:200000 IJ SOLN
INTRAMUSCULAR | Status: AC
  Filled 2020-03-08: qty 30

## 2020-03-08 SURGICAL SUPPLY — 63 items
"PENCIL ELECTRO HAND CTR " (MISCELLANEOUS) ×1 IMPLANT
ADH SKN CLS APL DERMABOND .7 (GAUZE/BANDAGES/DRESSINGS) ×1
ANCHOR TIS RET SYS 235ML (MISCELLANEOUS) ×1 IMPLANT
APL PRP STRL LF DISP 70% ISPRP (MISCELLANEOUS) ×1
BAG INFUSER PRESSURE 100CC (MISCELLANEOUS) IMPLANT
BAG TISS RTRVL C235 10X14 (MISCELLANEOUS)
BLADE SURG SZ11 CARB STEEL (BLADE) ×3 IMPLANT
CANISTER SUCT 1200ML W/VALVE (MISCELLANEOUS) ×1 IMPLANT
CANNULA REDUC XI 12-8 STAPL (CANNULA) ×1
CANNULA REDUC XI 12-8MM STAPL (CANNULA) ×1
CANNULA REDUCER 12-8 DVNC XI (CANNULA) ×1 IMPLANT
CATH REDDICK CHOLANGI 4FR 50CM (CATHETERS) IMPLANT
CHLORAPREP W/TINT 26 (MISCELLANEOUS) ×3 IMPLANT
CLIP VESOLOCK MED LG 6/CT (CLIP) ×3 IMPLANT
COVER TIP SHEARS 8 DVNC (MISCELLANEOUS) ×1 IMPLANT
COVER TIP SHEARS 8MM DA VINCI (MISCELLANEOUS) ×2
COVER WAND RF STERILE (DRAPES) ×3 IMPLANT
DECANTER SPIKE VIAL GLASS SM (MISCELLANEOUS) ×6 IMPLANT
DEFOGGER SCOPE WARMER CLEARIFY (MISCELLANEOUS) ×3 IMPLANT
DERMABOND ADVANCED (GAUZE/BANDAGES/DRESSINGS) ×2
DERMABOND ADVANCED .7 DNX12 (GAUZE/BANDAGES/DRESSINGS) ×1 IMPLANT
DRAPE ARM DVNC X/XI (DISPOSABLE) ×4 IMPLANT
DRAPE C-ARM XRAY 36X54 (DRAPES) IMPLANT
DRAPE COLUMN DVNC XI (DISPOSABLE) ×1 IMPLANT
DRAPE DA VINCI XI ARM (DISPOSABLE) ×8
DRAPE DA VINCI XI COLUMN (DISPOSABLE) ×2
ELECT CAUTERY BLADE 6.4 (BLADE) ×3 IMPLANT
ELECT REM PT RETURN 9FT ADLT (ELECTROSURGICAL) ×3
ELECTRODE REM PT RTRN 9FT ADLT (ELECTROSURGICAL) ×1 IMPLANT
GLOVE BIOGEL PI IND STRL 7.0 (GLOVE) ×2 IMPLANT
GLOVE BIOGEL PI INDICATOR 7.0 (GLOVE) ×4
GLOVE SURG SYN 6.5 ES PF (GLOVE) ×6 IMPLANT
GLOVE SURG SYN 6.5 PF PI (GLOVE) ×2 IMPLANT
GOWN STRL REUS W/ TWL LRG LVL3 (GOWN DISPOSABLE) ×3 IMPLANT
GOWN STRL REUS W/TWL LRG LVL3 (GOWN DISPOSABLE) ×9
GRASPER SUT TROCAR 14GX15 (MISCELLANEOUS) IMPLANT
IRRIGATOR SUCT 8 DISP DVNC XI (IRRIGATION / IRRIGATOR) IMPLANT
IRRIGATOR SUCTION 8MM XI DISP (IRRIGATION / IRRIGATOR)
IV NS 1000ML (IV SOLUTION)
IV NS 1000ML BAXH (IV SOLUTION) IMPLANT
LABEL OR SOLS (LABEL) ×3 IMPLANT
MANIFOLD NEPTUNE II (INSTRUMENTS) ×3 IMPLANT
NDL INSUFFLATION 14GA 120MM (NEEDLE) ×1 IMPLANT
NEEDLE HYPO 22GX1.5 SAFETY (NEEDLE) ×3 IMPLANT
NEEDLE INSUFFLATION 14GA 120MM (NEEDLE) ×3 IMPLANT
NS IRRIG 500ML POUR BTL (IV SOLUTION) ×3 IMPLANT
OBTURATOR OPTICAL STANDARD 8MM (TROCAR) ×2
OBTURATOR OPTICAL STND 8 DVNC (TROCAR) ×1
OBTURATOR OPTICALSTD 8 DVNC (TROCAR) ×1 IMPLANT
PACK LAP CHOLECYSTECTOMY (MISCELLANEOUS) ×3 IMPLANT
PENCIL ELECTRO HAND CTR (MISCELLANEOUS) ×3 IMPLANT
SEAL CANN UNIV 5-8 DVNC XI (MISCELLANEOUS) ×3 IMPLANT
SEAL XI 5MM-8MM UNIVERSAL (MISCELLANEOUS) ×6
SET TUBE SMOKE EVAC HIGH FLOW (TUBING) ×3 IMPLANT
SOLUTION ELECTROLUBE (MISCELLANEOUS) ×3 IMPLANT
STAPLER CANNULA SEAL DVNC XI (STAPLE) ×1 IMPLANT
STAPLER CANNULA SEAL XI (STAPLE) ×2
SUT MNCRL 4-0 (SUTURE) ×6
SUT MNCRL 4-0 27XMFL (SUTURE) ×2
SUT VICRYL 0 AB UR-6 (SUTURE) ×3 IMPLANT
SUTURE MNCRL 4-0 27XMF (SUTURE) ×2 IMPLANT
SYR 30ML LL (SYRINGE) IMPLANT
SYSTEM WECK SHIELD CLOSURE (TROCAR) ×2 IMPLANT

## 2020-03-08 NOTE — Op Note (Signed)
Preoperative diagnosis:  biliary colic  Postoperative diagnosis: chronic cholecystitis Procedure: Robotic assisted Laparoscopic Cholecystectomy.   Anesthesia: GETA   Surgeon: Sung Amabile  Specimen: Gallbladder  Complications: None  EBL: 7mL  Wound Classification: Clean Contaminated  Indications: see HPI  Findings: Chronic cholecystitis Critical view of safety noted Cystic duct and artery identified, ligated and divided, clips remained intact at end of procedure Adequate hemostasis  Description of procedure:  The patient was placed on the operating table in the supine position. SCDs placed, pre-op abx administered.  General anesthesia was induced and OG tube placed by anesthesia. A time-out was completed verifying correct patient, procedure, site, positioning, and implant(s) and/or special equipment prior to beginning this procedure. The abdomen was prepped and draped in the usual sterile fashion.    Veress needle was placed at the Palmer's point and insufflation was started after confirming a positive saline drop test and no immediate increase in abdominal pressure.  After reaching 15 mm, the Veress needle was removed and a 8 mm port was placed via optiview technique under umbilicus measured 64mm from gallbladder.  The abdomen was inspected and no abnormalities or injuries were found.  Under direct vision, ports were placed in the following locations: One 12 mm patient left of the umbilicus, 8cm from the optiviewed port, one 8 mm port placed to the patient right of the umbilical port 8 cm apart.  1 additional 8 mm port placed lateral to the 56mm port.  Once ports were placed, The table was placed in the reverse Trendelenburg position with the right side up. The Xi platform was brought into the operative field and docked to the ports successfully.  An endoscope was placed through the umbilical port, fenestrated grasper through the adjacent patient right port, prograsp to the far patient  left port, and then a hook cautery in the left port.  The dome of the gallbladder was grasped with prograsp, passed and retracted over the dome of the liver. Adhesions between the gallbladder and omentum, duodenum and transverse colon were lysed via hook cautery. The chronically inflammed gallbladder was decompressed with a needle to be able to use graspers to retract.The infundibulum was grasped with the fenestrated grasper and retracted toward the right lower quadrant. This maneuver exposed Calot's triangle. The peritoneum overlying the gallbladder infundibulum was then dissected using combination of Maryland dissector and electrocautery hook and the cystic duct and cystic artery identified.  Critical view of safety with the liver bed clearly visible behind the duct and artery with no additional structures noted.  The cystic duct and cystic artery clipped and divided close to the gallbladder.     The gallbladder was then dissected from its peritoneal and liver bed attachments by electrocautery. Hemostasis was checked prior to removing the hook cautery and the Endo Catch bag was then placed through the 12 mm port and the gallbladder was removed.  The gallbladder was passed off the table as a specimen. There was no evidence of bleeding from the gallbladder fossa or cystic artery or leakage of the bile from the cystic duct stump. The 12 mm port site closed with Efx Shield using 0 vicryl under direct vision.  Abdomen desufflated and secondary trocars were removed under direct vision. No bleeding was noted. All skin incisions then closed with subcuticular sutures of 4-0 monocryl and dressed with topical skin adhesive. The orogastric tube was removed and patient extubated.  The patient tolerated the procedure well and was taken to the postanesthesia care unit in stable  condition.  All sponge and instrument count correct at end of procedure.

## 2020-03-08 NOTE — Interval H&P Note (Signed)
History and Physical Interval Note:  03/08/2020 11:52 AM  Olivia Gillespie  has presented today for surgery, with the diagnosis of K80.50 Biliary colic.  The various methods of treatment have been discussed with the patient and family. After consideration of risks, benefits and other options for treatment, the patient has consented to  Procedure(s): XI ROBOTIC ASSISTED LAPAROSCOPIC CHOLECYSTECTOMY (N/A) as a surgical intervention.  The patient's history has been reviewed, patient examined, no change in status, stable for surgery.  I have reviewed the patient's chart and labs.  Questions were answered to the patient's satisfaction.     Gregroy Dombkowski Tonna Boehringer

## 2020-03-08 NOTE — Anesthesia Procedure Notes (Signed)
Procedure Name: Intubation Date/Time: 03/08/2020 12:50 PM Performed by: Omer Jack, CRNA Pre-anesthesia Checklist: Patient identified, Patient being monitored, Timeout performed, Emergency Drugs available and Suction available Patient Re-evaluated:Patient Re-evaluated prior to induction Oxygen Delivery Method: Circle system utilized Preoxygenation: Pre-oxygenation with 100% oxygen Induction Type: IV induction Ventilation: Mask ventilation without difficulty Laryngoscope Size: 3 and McGraph Grade View: Grade I Tube type: Oral Tube size: 7.0 mm Number of attempts: 1 Airway Equipment and Method: Stylet Placement Confirmation: ETT inserted through vocal cords under direct vision,  positive ETCO2 and breath sounds checked- equal and bilateral Secured at: 21 cm Tube secured with: Tape Dental Injury: Teeth and Oropharynx as per pre-operative assessment

## 2020-03-08 NOTE — Discharge Instructions (Signed)
Laparoscopic Cholecystectomy, Care After This sheet gives you information about how to care for yourself after your procedure. Your doctor may also give you more specific instructions. If you have problems or questions, contact your doctor. Follow these instructions at home: Care for cuts from surgery (incisions)   Follow instructions from your doctor about how to take care of your cuts from surgery. Make sure you: ? Wash your hands with soap and water before you change your bandage (dressing). If you cannot use soap and water, use hand sanitizer. ? Change your bandage as told by your doctor. ? Leave stitches (sutures), skin glue, or skin tape (adhesive) strips in place. They may need to stay in place for 2 weeks or longer. If tape strips get loose and curl up, you may trim the loose edges. Do not remove tape strips completely unless your doctor says it is okay.  Do not take baths, swim, or use a hot tub until your doctor says it is okay. OK TO SHOWER 24HRS AFTER YOUR SURGERY.   Check your surgical cut area every day for signs of infection. Check for: ? More redness, swelling, or pain. ? More fluid or blood. ? Warmth. ? Pus or a bad smell. Activity  Do not drive or use heavy machinery while taking prescription pain medicine.  Do not play contact sports until your doctor says it is okay.  Do not drive for 24 hours if you were given a medicine to help you relax (sedative).  Rest as needed. Do not return to work or school until your doctor says it is okay. General instructions   Advil as needed for discomfort.     Use narcotics, if prescribed, only advil is not enough to control pain.   325-650mg  every 8hrs to max of 3000mg /24hrs (including the 325mg  in every norco dose) for the tylenol in excederin and the norco.     Advil up to 800mg  per dose every 8hrs as needed for pain.    To prevent or treat constipation while you are taking prescription pain medicine, your doctor may  recommend that you: ? Drink enough fluid to keep your pee (urine) clear or pale yellow. ? Take over-the-counter or prescription medicines. ? Eat foods that are high in fiber, such as fresh fruits and vegetables, whole grains, and beans. ? Limit foods that are high in fat and processed sugars, such as fried and sweet foods. Contact a doctor if:  You develop a rash.  You have more redness, swelling, or pain around your surgical cuts.  You have more fluid or blood coming from your surgical cuts.  Your surgical cuts feel warm to the touch.  You have pus or a bad smell coming from your surgical cuts.  You have a fever.  One or more of your surgical cuts breaks open. Get help right away if:  You have trouble breathing.  You have chest pain.  You have pain that is getting worse in your shoulders.  You faint or feel dizzy when you stand.  You have very bad pain in your belly (abdomen).  You are sick to your stomach (nauseous) for more than one day.  You have throwing up (vomiting) that lasts for more than one day.  You have leg pain. This information is not intended to replace advice given to you by your health care provider. Make sure you discuss any questions you have with your health care provider. Document Released: 01/01/2008 Document Revised: 10/13/2015 Document Reviewed: 09/10/2015 Elsevier  Interactive Patient Education  2019 [employer]  AMBULATORY SURGERY  DISCHARGE INSTRUCTIONS   1) The drugs that you were given will stay in your system until tomorrow so for the next 24 hours you should not:  A) Drive an automobile B) Make any legal decisions C) Drink any alcoholic beverage   2) You may resume regular meals tomorrow.  Today it is better to start with liquids and gradually work up to solid foods.  You may eat anything you prefer, but it is better to start with liquids, then soup and crackers, and gradually work up to solid foods.   3) Please notify your  doctor immediately if you have any unusual bleeding, trouble breathing, redness and pain at the surgery site, drainage, fever, or pain not relieved by medication.    4) Additional Instructions:        Please contact your physician with any problems or Same Day Surgery at [phone], Monday through Friday 6 am to 4 pm, or [place] at [facility] number at [phone].

## 2020-03-08 NOTE — Transfer of Care (Signed)
Immediate Anesthesia Transfer of Care Note  Patient: Olivia Gillespie  Procedure(s) Performed: XI ROBOTIC ASSISTED LAPAROSCOPIC CHOLECYSTECTOMY (N/A Abdomen)  Patient Location: PACU  Anesthesia Type:General  Level of Consciousness: sedated  Airway & Oxygen Therapy: Patient connected to face mask oxygen  Post-op Assessment: Post -op Vital signs reviewed and stable  Post vital signs: stable  Last Vitals:  Vitals Value Taken Time  BP 105/64 03/08/20 1433  Temp    Pulse 73 03/08/20 1435  Resp 13 03/08/20 1435  SpO2 100 % 03/08/20 1435  Vitals shown include unvalidated device data.  Last Pain:  Vitals:   03/08/20 1100  TempSrc: Temporal  PainSc: 0-No pain         Complications: No complications documented.

## 2020-03-08 NOTE — Anesthesia Preprocedure Evaluation (Signed)
Anesthesia Evaluation  Patient identified by MRN, date of birth, ID band Patient awake    Reviewed: Allergy & Precautions, NPO status , Patient's Chart, lab work & pertinent test results  Airway Mallampati: I       Dental no notable dental hx.  Braces:   Pulmonary neg pulmonary ROS,    Pulmonary exam normal breath sounds clear to auscultation       Cardiovascular negative cardio ROS Normal cardiovascular exam Rhythm:Regular Rate:Normal     Neuro/Psych  Headaches, PSYCHIATRIC DISORDERS Anxiety Depression    GI/Hepatic Neg liver ROS, GERD  ,  Endo/Other  negative endocrine ROS  Renal/GU negative Renal ROS  negative genitourinary   Musculoskeletal negative musculoskeletal ROS (+)   Abdominal   Peds negative pediatric ROS (+)  Hematology negative hematology ROS (+)   Anesthesia Other Findings Past Medical History: No date: Acne No date: Anxiety No date: Bacterial vaginitis No date: Depression No date: GERD (gastroesophageal reflux disease) No date: Headache No date: Hyperlipidemia No date: Insomnia No date: PCOS (polycystic ovarian syndrome) No date: Seasonal allergies   Reproductive/Obstetrics negative OB ROS                             Anesthesia Physical Anesthesia Plan  ASA: II  Anesthesia Plan: General   Post-op Pain Management:    Induction: Intravenous  PONV Risk Score and Plan: 3 and Ondansetron and Dexamethasone  Airway Management Planned: Oral ETT  Additional Equipment:   Intra-op Plan:   Post-operative Plan: Extubation in OR  Informed Consent: I have reviewed the patients History and Physical, chart, labs and discussed the procedure including the risks, benefits and alternatives for the proposed anesthesia with the patient or authorized representative who has indicated his/her understanding and acceptance.       Plan Discussed with: CRNA,  Anesthesiologist and Surgeon  Anesthesia Plan Comments:         Anesthesia Quick Evaluation

## 2020-03-09 NOTE — Anesthesia Postprocedure Evaluation (Signed)
Anesthesia Post Note  Patient: Olivia Gillespie  Procedure(s) Performed: XI ROBOTIC ASSISTED LAPAROSCOPIC CHOLECYSTECTOMY (N/A Abdomen)  Patient location during evaluation: PACU Anesthesia Type: General Level of consciousness: awake Pain management: pain level controlled Vital Signs Assessment: post-procedure vital signs reviewed and stable Respiratory status: spontaneous breathing Cardiovascular status: blood pressure returned to baseline and stable Postop Assessment: no apparent nausea or vomiting Anesthetic complications: no   No complications documented.   Last Vitals:  Vitals:   03/08/20 1636 03/08/20 1700  BP: 103/61 (!) 103/56  Pulse: 60 68  Resp: 18   Temp: (!) 36.4 C   SpO2: 100% 100%    Last Pain:  Vitals:   03/08/20 1700  TempSrc:   PainSc: 3                  Emilio Math

## 2020-03-12 LAB — SURGICAL PATHOLOGY

## 2020-04-18 ENCOUNTER — Other Ambulatory Visit: Payer: Self-pay | Admitting: Certified Nurse Midwife

## 2020-07-11 ENCOUNTER — Other Ambulatory Visit: Payer: Self-pay | Admitting: Certified Nurse Midwife

## 2020-08-17 ENCOUNTER — Ambulatory Visit (INDEPENDENT_AMBULATORY_CARE_PROVIDER_SITE_OTHER): Payer: BC Managed Care – PPO | Admitting: Certified Nurse Midwife

## 2020-08-17 ENCOUNTER — Other Ambulatory Visit (HOSPITAL_COMMUNITY)
Admission: RE | Admit: 2020-08-17 | Discharge: 2020-08-17 | Disposition: A | Discharge status: Home / Self Care | Payer: BC Managed Care – PPO | Source: Ambulatory Visit | Attending: Certified Nurse Midwife | Admitting: Certified Nurse Midwife

## 2020-08-17 ENCOUNTER — Other Ambulatory Visit: Payer: Self-pay

## 2020-08-17 ENCOUNTER — Encounter: Payer: Self-pay | Admitting: Certified Nurse Midwife

## 2020-08-17 VITALS — BP 120/83 | HR 108 | Resp 16 | Ht 66.0 in | Wt 145.3 lb

## 2020-08-17 DIAGNOSIS — Z01419 Encounter for gynecological examination (general) (routine) without abnormal findings: Secondary | ICD-10-CM | POA: Insufficient documentation

## 2020-08-17 DIAGNOSIS — Z8742 Personal history of other diseases of the female genital tract: Secondary | ICD-10-CM

## 2020-08-17 DIAGNOSIS — Z124 Encounter for screening for malignant neoplasm of cervix: Secondary | ICD-10-CM

## 2020-08-17 MED ORDER — ETONOGESTREL-ETHINYL ESTRADIOL 0.12-0.015 MG/24HR VA RING: VAGINAL_RING | VAGINAL | 4 refills | Status: DC

## 2020-08-17 NOTE — Progress Notes (Signed)
ANNUAL PREVENTATIVE CARE GYN  ENCOUNTER NOTE  Subjective:       Olivia Gillespie is a 25 y.o. G0P0000 female here for a routine annual gynecologic exam.  Current complaints: 1. Requests Nuvaring refill 2. Wishes to continue Aldactone 3. Needs Pap smear  Denies difficulty breathing or respiratory distress, chest pain, abdominal pain, excessive vaginal bleeding, dysuria, and leg pain or swelling.    Gynecologic History  Patient's last menstrual period was 07/16/2020.  Contraception: NuvaRing vaginal inserts  Last Pap: 04/2017. Results were: normal  Obstetric History  OB History  Gravida Para Term Preterm AB Living  0 0 0 0 0 0  SAB IAB Ectopic Multiple Live Births  0 0 0 0 0    Past Medical History:  Diagnosis Date  . Acne   . Anxiety   . Bacterial vaginitis   . Depression   . GERD (gastroesophageal reflux disease)   . Headache   . Hyperlipidemia   . Insomnia   . PCOS (polycystic ovarian syndrome)   . Seasonal allergies     Past Surgical History:  Procedure Laterality Date  . WISDOM TOOTH EXTRACTION  2013    Current Outpatient Medications on File Prior to Visit  Medication Sig Dispense Refill  . Acetaminophen-Caffeine (EXCEDRIN TENSION HEADACHE) 500-65 MG TABS Take 3 tablets by mouth 2 (two) times daily as needed (headache).    Marland Kitchen buPROPion (WELLBUTRIN XL) 150 MG 24 hr tablet Take 150 mg by mouth daily.    Marland Kitchen etonogestrel-ethinyl estradiol (NUVARING) 0.12-0.015 MG/24HR vaginal ring INSERT 1 RING VAGINALLY AND LEAVE IN PLACE FOR 3 CONSECUTIVE WEEKS, THEN REMOVE FOR 1 WEEK 3 each 0  . fluticasone (FLONASE) 50 MCG/ACT nasal spray Place 2 sprays into both nostrils daily as needed for allergies or rhinitis.    Marland Kitchen ibuprofen (ADVIL) 800 MG tablet Take 1 tablet (800 mg total) by mouth every 8 (eight) hours as needed for mild pain or moderate pain. 30 tablet 0  . loratadine (CLARITIN) 10 MG tablet Take 10 mg by mouth daily.    . melatonin 5 MG TABS Take 5 mg by mouth at  bedtime.     Marland Kitchen spironolactone (ALDACTONE) 100 MG tablet TAKE 2 TABLETS BY MOUTH DIALY 60 tablet 6   No current facility-administered medications on file prior to visit.    No Known Allergies  Social History   Socioeconomic History  . Marital status: Married    Spouse name: Not on file  . Number of children: Not on file  . Years of education: Not on file  . Highest education level: Not on file  Occupational History  . Not on file  Tobacco Use  . Smoking status: Never Smoker  . Smokeless tobacco: Never Used  Vaping Use  . Vaping Use: Never used  Substance and Sexual Activity  . Alcohol use: No  . Drug use: No  . Sexual activity: Yes    Birth control/protection: Other-see comments    Comment: Nuvaring  Other Topics Concern  . Not on file  Social History Narrative  . Not on file   Social Determinants of Health   Financial Resource Strain: Not on file  Food Insecurity: Not on file  Transportation Needs: Not on file  Physical Activity: Not on file  Stress: Not on file  Social Connections: Not on file  Intimate Partner Violence: Not on file    Family History  Problem Relation Age of Onset  . Hypertension Mother   . Mental illness Maternal Grandmother   .  Hyperlipidemia Paternal Grandfather   . Diabetes Paternal Grandfather   . Breast cancer Neg Hx   . Ovarian cancer Neg Hx   . Colon cancer Neg Hx     The following portions of the patient's history were reviewed and updated as appropriate: allergies, current medications, past family history, past medical history, past social history, past surgical history and problem list.  Review of Systems  ROS negative except as noted above. Information obtained from patient.    Objective:   BP 120/83   Pulse (!) 108   Resp 16   Ht 5\' 6"  (1.676 m)   Wt 145 lb 4.8 oz (65.9 kg)   LMP 07/16/2020   BMI 23.45 kg/m    CONSTITUTIONAL: Well-developed, well-nourished female in no acute distress.   PSYCHIATRIC: Normal mood  and affect. Normal behavior. Normal judgment and thought content.  NEUROLGIC: Alert and oriented to person, place, and time. Normal muscle tone coordination. No cranial  nerve deficit noted.  HENT:  Normocephalic, atraumatic.  EYES: Conjunctivae and EOM are normal.   NECK: Normal range of motion, supple, no masses.  Normal thyroid.   SKIN: Skin is warm and dry. No rash noted. Not diaphoretic. No erythema. No pallor.  CARDIOVASCULAR: Normal heart rate noted, regular rhythm, no murmur.  RESPIRATORY: Clear to auscultation bilaterally. Effort and breath sounds normal, no problems with respiration noted.  BREASTS: Declined.   ABDOMEN: Soft, normal bowel sounds, no distention noted.  No tenderness, rebound or guarding.   PELVIC:  External Genitalia: Normal  Vagina: Normal  Cervix: Normal, Pap collected  Uterus: Normal  Adnexa: Normal  MUSCULOSKELETAL: Normal range of motion. No tenderness.  No cyanosis, clubbing, or edema.  2+ distal pulses.  LYMPHATIC: No Axillary, Supraclavicular, or Inguinal Adenopathy.  Depression screen [facility] 2/9 08/17/2020 11/21/2015  Decreased Interest 0 0  Down, Depressed, Hopeless 0 0  PHQ - 2 Score 0 0    Assessment:   Annual gynecologic examination 25 y.o.   Contraception: NuvaRing vaginal inserts   Normal BMI   Problem List Items Addressed This Visit   None   Visit Diagnoses    Well woman exam    -  Primary   Relevant Orders   Comprehensive metabolic panel   Cytology - PAP   History of PCOS       Relevant Orders   Comprehensive metabolic panel   Screening for cervical cancer       Relevant Orders   Cytology - PAP      Plan:   Pap: Pap, Reflex if ASCUS  Labs: See orders  Routine preventative health maintenance measures emphasized: Exercise/Diet/Weight control, Tobacco Warnings, Alcohol/Substance use risks and Stress Management; see AVS  Rx Nuvaring, see orders  Reviewed red flag symptoms and when to call  Return to Clinic -  1 Year for 25 or sooner if needed   [employer], CNM  [facility], [facility] 08/17/20 3:58 PM

## 2020-08-17 NOTE — Patient Instructions (Addendum)
Spironolactone Oral Tablets What is this medicine? SPIRONOLACTONE (speer on [place] LAK tone) is a diuretic. It helps you make more urine and to lose salt and excess water from your body. It treats swelling from heart, kidney, or liver disease. It treats high blood pressure. It also treats high aldosterone levels in the blood. This medicine may be used for other purposes; ask your health care provider or pharmacist if you have questions. COMMON BRAND NAME(S): Aldactone What should I tell my health care provider before I take this medicine? They need to know if you have any of these conditions:  Addison's disease or low adrenal gland function  high blood level of potassium  kidney disease  liver disease  an unusual or allergic reaction to spironolactone, other medicines, foods, dyes, or preservatives  pregnant or trying to get pregnant  breast-feeding How should I use this medicine? Take this medicine by mouth. Take it as directed on the prescription label at the same time every day. You can take it with or without food. You should always take it the same way. Keep taking it unless your health care provider tells you to stop. Talk to your health care provider about the use of this drug in children. Special care may be needed. Overdosage: If you think you have taken too much of this medicine contact a poison control center or emergency room at once. NOTE: This medicine is only for you. Do not share this medicine with others. What if I miss a dose? If you miss a dose, take it as soon as you can. If it is almost time for your next dose, take only that dose. Do not take double or extra doses. What may interact with this medicine? Do not take this medicine with any of the following medications:  cidofovir  eplerenone  tranylcypromine This medicine may also interact with the following medications:  aspirin  certain medicines for blood pressure or heart disease like benazepril, lisinopril,  losartan, valsartan  certain medicines that treat or prevent blood clots like heparin and enoxaparin  cholestyramine  cyclosporine  digoxin  lithium  medicines that relax muscles for surgery  NSAIDs, medicines for pain and inflammation, like ibuprofen or naproxen  other diuretics  potassium salts or supplements  steroid medicines like prednisone or cortisone  trimethoprim This list may not describe all possible interactions. Give your health care provider a list of all the medicines, herbs, non-prescription drugs, or dietary supplements you use. Also tell them if you smoke, drink alcohol, or use illegal drugs. Some items may interact with your medicine. What should I watch for while using this medicine? Visit your doctor or health care provider for regular checks on your progress. Check your blood pressure as directed. Ask your health care provider what your blood pressure should be. Also, find out when you should contact him or her. Do not treat yourself for coughs, colds, or pain while you are using this medicine without asking your health care provider for advice. Some medicines may increase your blood pressure. Check with your health care provider if you have severe diarrhea, nausea, and vomiting, or if you sweat a lot. The loss of too much body fluid may make it dangerous for you to take this medicine. You may need to be on a special diet while taking this medicine. Ask your health care provider. Also, find out how many glasses of fluid you need to drink each day. You may get drowsy or dizzy. Do not drive, use  machinery, or do anything that needs mental alertness until you know how this medicine affects you. Do not stand or sit up quickly, especially if you are an older patient. This reduces the risk of dizzy or fainting spells. Alcohol may interfere with the effects of this medicine. Avoid alcoholic drinks. Avoid salt substitutes unless you are told otherwise by your health care  provider. What side effects may I notice from receiving this medicine? Side effects that you should report to your health care provider as soon as possible:  allergic reactions (skin rash, itching or hives, swelling of the face, lips, or tongue)  breast enlargement in both males and females  changes in menstrual cycle  gout (severe pain, redness, or swelling in joints like the big toe)  high blood sugar (increased hunger, thirst or urination; unusually weak or tired, blurry vision)  high potassium levels (chest pain; fast irregular heartbeat; muscle weakness)  kidney injury (trouble passing urine or change in the amount of urine)  low blood pressure (dizziness; feeling faint or lightheaded, falls; unusually weak or tired)  low calcium levels (fast heartbeat; muscle cramps or pain; pain, tingling, or numbness in the hands or feet; seizures)  low magnesium levels (fast, irregular heartbeat; feeling faint or lightheaded, falls; muscle cramps or pain) Side effects that usually do not require medical attention (report to your health care provider if they continue or are bothersome):  changes in sex drive or performance  dizziness  headache  upset stomach This list may not describe all possible side effects. Call your doctor for medical advice about side effects. You may report side effects to FDA at 1-800-FDA-1088. Where should I keep my medicine? Keep out of the reach of children and pets. Store below 25 degrees C (77 degrees F). Get rid of any unused medicine after the expiration date. To get rid of medicines that are no longer needed or have expired:  Take the medicine to a medicine take-back program. Check with your pharmacy or law enforcement to find a location.  If you cannot return the medicine, check the label or package insert to see if the medicine should be thrown out in the garbage or flushed down the toilet. If you are not sure, ask your health care provider. If it is  safe to put into the trash, take the medicine out of the container. Mix the medicine with cat litter, dirt, coffee grounds, or other unwanted substance. Seal the mixture in a bag or container. Put it in the trash. NOTE: This sheet is a summary. It may not cover all possible information. If you have questions about this medicine, talk to your doctor, pharmacist, or health care provider.  2021 Elsevier/Gold Standard (2019-06-10 19:57:34)   Etonogestrel; Ethinyl Estradiol Vaginal Ring What is this medicine? ETONOGESTREL; ETHINYL ESTRADIOL (et oh noe JES trel; ETH in il es tra DYE ole) vaginal ring is a flexible, vaginal ring used as a contraceptive (birth control method). This product combines two types of female hormones, an estrogen and a progestin. It is used to prevent ovulation and pregnancy. Each ring is effective for 1 month. This medicine may be used for other purposes; ask your health care provider or pharmacist if you have questions. COMMON BRAND NAME(S): EluRyng, NuvaRing What should I tell my health care provider before I take this medicine? They need to know if you have any of these conditions:  abnormal vaginal bleeding  blood vessel disease or blood clots  breast, cervical, endometrial, ovarian, liver, or  uterine cancer  diabetes  gallbladder disease  having surgery  heart disease or recent heart attack  high blood pressure  high cholesterol or triglycerides  history of irregular heartbeat or heart valve problems  kidney disease  liver disease  migraine headaches  protein C deficiency  protein S deficiency  recently had a baby, miscarriage, or abortion  stroke  systemic lupus erythematosus (SLE)  tobacco smoker  your age is more than 27 years old  an unusual or allergic reaction to estrogens, progestins, other medicines, foods, dyes, or preservatives  pregnant or trying to get pregnant  breast-feeding How should I use this medicine? Insert the  ring into your vagina as directed. Follow the directions on the prescription label. The ring will remain place for 3 weeks and is then removed for a 1-week break. A new ring is inserted 1 week after the last ring was removed, on the same day of the week. Check often to make sure the ring is still in place. If the ring was out of the vagina for an unknown amount of time, you may not be protected from pregnancy. Perform a pregnancy test and call your doctor. Do not use more often than directed. A patient package insert for the product will be given with each prescription and refill. Read this sheet carefully each time. The sheet may change frequently. Contact your pediatrician regarding the use of this medicine in children. Special care may be needed. Overdosage: If you think you have taken too much of this medicine contact a poison control center or emergency room at once. NOTE: This medicine is only for you. Do not share this medicine with others. What if I miss a dose? You will need to use the ring exactly as directed. It is very important to follow the schedule every cycle. If you do not use the ring as directed, you may not be protected from pregnancy. If the ring should slip out, is lost, or if you leave it in longer or shorter than you should, contact your health care professional for advice. What may interact with this medicine? Do not take this medicine with the following medications:  dasabuvir; ombitasvir; paritaprevir; ritonavir  ombitasvir; paritaprevir; ritonavir  vaginal lubricants or other vaginal products that are oil-based or silicone-based This medicine may also interact with the following medications:  acetaminophen  antibiotics or medicines for infections, especially rifampin, rifabutin, rifapentine, and griseofulvin, and possibly penicillins or tetracyclines  aprepitant or fosaprepitant  armodafinil  ascorbic acid (vitamin C)  barbiturate medicines, such as  phenobarbital or primidone  bosentan  certain antiviral medicines for hepatitis, HIV or AIDS  certain medicines for cancer treatment  certain medicines for seizures like carbamazepine, clobazam, felbamate, lamotrigine, oxcarbazepine, phenytoin, rufinamide, topiramate  certain medicines for treating high cholesterol  cyclosporine  dantrolene  elagolix  flibanserin  grapefruit juice  lesinurad  medicines for diabetes  medicines to treat fungal infections, such as griseofulvin, miconazole, fluconazole, ketoconazole, itraconazole, posaconazole or voriconazole  mifepristone  mitotane  modafinil  morphine  mycophenolate  St. John's wort  tamoxifen  temazepam  theophylline or aminophylline  thyroid hormones  tizanidine  tranexamic acid  ulipristal  warfarin This list may not describe all possible interactions. Give your health care provider a list of all the medicines, herbs, non-prescription drugs, or dietary supplements you use. Also tell them if you smoke, drink alcohol, or use illegal drugs. Some items may interact with your medicine. What should I watch for while using this medicine? Visit  your doctor or health care professional for regular checks on your progress. You will need a regular breast and pelvic exam and Pap smear while on this medicine. Check with your doctor or health care professional to see if you need an additional method of contraception during the first cycle that you use this ring. Female condoms (made with natural rubber latex, polyisoprene, and polyurethane) and spermicides may be used. Do not use a diaphragm, cervical cap, or a female condom, as the ring can interfere with these birth control methods and their proper placement. If you have any reason to think you are pregnant, stop using this medicine right away and contact your doctor or health care professional. If you are using this medicine for hormone related problems, it may take  several cycles of use to see improvement in your condition. Smoking increases the risk of getting a blood clot or having a stroke while you are using hormonal birth control, especially if you are more than 27 years old. You are strongly advised not to smoke. Some women are prone to getting dark patches on the skin of the face (cholasma). Your risk of getting chloasma with this medicine is higher if you had chloasma during a pregnancy. Keep out of the sun. If you cannot avoid being in the sun, wear protective clothing and use sunscreen. Do not use sun lamps or tanning beds/booths. This medicine can make your body retain fluid, making your fingers, hands, or ankles swell. Your blood pressure can go up. Contact your doctor or health care professional if you feel you are retaining fluid. If you are going to have elective surgery, you may need to stop using this medicine before the surgery. Consult your health care professional for advice. This medicine does not protect you against HIV infection (AIDS) or any other sexually transmitted diseases. What side effects may I notice from receiving this medicine? Side effects that you should report to your doctor or health care professional as soon as possible:  allergic reactions such as skin rash or itching, hives, swelling of the lips, mouth, tongue, or throat  depression  high blood pressure  migraines or severe, sudden headaches  signs and symptoms of a blood clot such as breathing problems; changes in vision; chest pain; severe, sudden headache; pain, swelling, warmth in the leg; trouble speaking; sudden numbness or weakness of the face, arm or leg  signs and symptoms of infection like fever or chills with dizziness and a sunburn-like rash, or pain or trouble passing urine  stomach pain  symptoms of vaginal infection like itching, irritation or unusual discharge  yellowing of the eyes or skin Side effects that usually do not require medical  attention (report these to your doctor or health care professional if they continue or are bothersome):  acne  breast pain, tenderness  irregular vaginal bleeding or spotting, particularly during the first month of use  mild headache  nausea  painful periods  vomiting This list may not describe all possible side effects. Call your doctor for medical advice about side effects. You may report side effects to FDA at 1-800-FDA-1088. Where should I keep my medicine? Keep out of the reach of children. Store unopened medicine for up to 4 months at room temperature at 15 and 30 degrees C (59 and 86 degrees F). Protect from light. Do not store above 30 degrees C (86 degrees F). Throw away any unused medicine 4 months after the dispense date or the expiration date, whichever comes first.  A ring may only be used for 1 cycle (1 month). After the 3-week cycle, a used ring is removed and should be placed in the re-closable foil pouch and discarded in the trash out of reach of children and pets. Do NOT flush down the toilet. NOTE: This sheet is a summary. It may not cover all possible information. If you have questions about this medicine, talk to your doctor, pharmacist, or health care provider.  2021 Elsevier/Gold Standard (2019-02-09 16:10:96)   Preventive Care 33-22 Years Old, Female Preventive care refers to lifestyle choices and visits with your health care provider that can promote health and wellness. This includes:  A yearly physical exam. This is also called an annual wellness visit.  Regular dental and eye exams.  Immunizations.  Screening for certain conditions.  Healthy lifestyle choices, such as: ? Eating a healthy diet. ? Getting regular exercise. ? Not using drugs or products that contain nicotine and tobacco. ? Limiting alcohol use. What can I expect for my preventive care visit? Physical exam Your health care provider may check your:  Height and weight. These may be  used to calculate your BMI (body mass index). BMI is a measurement that tells if you are at a healthy weight.  Heart rate and blood pressure.  Body temperature.  Skin for abnormal spots. Counseling Your health care provider may ask you questions about your:  Past medical problems.  Family's medical history.  Alcohol, tobacco, and drug use.  Emotional well-being.  Home life and relationship well-being.  Sexual activity.  Diet, exercise, and sleep habits.  Work and work Statistician.  Access to firearms.  Method of birth control.  Menstrual cycle.  Pregnancy history. What immunizations do I need? Vaccines are usually given at various ages, according to a schedule. Your health care provider will recommend vaccines for you based on your age, medical history, and lifestyle or other factors, such as travel or where you work.   What tests do I need? Blood tests  Lipid and cholesterol levels. These may be checked every 5 years starting at age 61.  Hepatitis C test.  Hepatitis B test. Screening  Diabetes screening. This is done by checking your blood sugar (glucose) after you have not eaten for a while (fasting).  STD (sexually transmitted disease) testing, if you are at risk.  BRCA-related cancer screening. This may be done if you have a family history of breast, ovarian, tubal, or peritoneal cancers.  Pelvic exam and Pap test. This may be done every 3 years starting at age 5. Starting at age 78, this may be done every 5 years if you have a Pap test in combination with an HPV test. Talk with your health care provider about your test results, treatment options, and if necessary, the need for more tests.   Follow these instructions at home: Eating and drinking  Eat a healthy diet that includes fresh fruits and vegetables, whole grains, lean protein, and low-fat dairy products.  Take vitamin and mineral supplements as recommended by your health care provider.  Do not  drink alcohol if: ? Your health care provider tells you not to drink. ? You are pregnant, may be pregnant, or are planning to become pregnant.  If you drink alcohol: ? Limit how much you have to 0-1 drink a day. ? Be aware of how much alcohol is in your drink. In the [place] one drink equals one 12 oz bottle of beer (355 mL), one 5 oz glass of wine (  148 mL), or one 1 oz glass of hard liquor (44 mL).   Lifestyle  Take daily care of your teeth and gums. Brush your teeth every morning and night with fluoride toothpaste. Floss one time each day.  Stay active. Exercise for at least 30 minutes 5 or more days each week.  Do not use any products that contain nicotine or tobacco, such as cigarettes, e-cigarettes, and chewing tobacco. If you need help quitting, ask your health care provider.  Do not use drugs.  If you are sexually active, practice safe sex. Use a condom or other form of protection to prevent STIs (sexually transmitted infections).  If you do not wish to become pregnant, use a form of birth control. If you plan to become pregnant, see your health care provider for a prepregnancy visit.  Find healthy ways to cope with stress, such as: ? Meditation, yoga, or listening to music. ? Journaling. ? Talking to a trusted person. ? Spending time with friends and family. Safety  Always wear your seat belt while driving or riding in a vehicle.  Do not drive: ? If you have been drinking alcohol. Do not ride with someone who has been drinking. ? When you are tired or distracted. ? While texting.  Wear a helmet and other protective equipment during sports activities.  If you have firearms in your house, make sure you follow all gun safety procedures.  Seek help if you have been physically or sexually abused. What's next?  Go to your health care provider once a year for an annual wellness visit.  Ask your health care provider how often you should have your eyes and teeth  checked.  Stay up to date on all vaccines. This information is not intended to replace advice given to you by your health care provider. Make sure you discuss any questions you have with your health care provider. Document Revised: 11/20/2019 Document Reviewed: 12/03/2017 Elsevier Patient Education  2021 [employer].

## 2020-08-18 LAB — COMPREHENSIVE METABOLIC PANEL
ALT: 11 IU/L (ref 0–32)
AST: 9 IU/L (ref 0–40)
Albumin/Globulin Ratio: 1.4 (ref 1.2–2.2)
Albumin: 4.1 g/dL (ref 3.9–5.0)
Alkaline Phosphatase: 58 IU/L (ref 44–121)
BUN/Creatinine Ratio: 12 (ref 9–23)
BUN: 9 mg/dL (ref 6–20)
Bilirubin Total: <0.2 mg/dL (ref 0.0–1.2)
CO2: 22 mmol/L (ref 20–29)
Calcium: 9.6 mg/dL (ref 8.7–10.2)
Chloride: 104 mmol/L (ref 96–106)
Creatinine, Ser: 0.75 mg/dL (ref 0.57–1.00)
Globulin, Total: 3 g/dL (ref 1.5–4.5)
Glucose: 85 mg/dL (ref 65–99)
Potassium: 4.5 mmol/L (ref 3.5–5.2)
Sodium: 141 mmol/L (ref 134–144)
Total Protein: 7.1 g/dL (ref 6.0–8.5)
eGFR: 112 mL/min/{1.73_m2} (ref >59)

## 2020-08-27 LAB — CYTOLOGY - PAP: Diagnosis: NEGATIVE

## 2020-09-16 ENCOUNTER — Other Ambulatory Visit: Payer: Self-pay | Admitting: Certified Nurse Midwife

## 2020-10-15 DIAGNOSIS — E782 Mixed hyperlipidemia: Secondary | ICD-10-CM | POA: Insufficient documentation

## 2021-01-22 ENCOUNTER — Encounter: Payer: BC Managed Care – PPO | Admitting: Certified Nurse Midwife

## 2021-02-27 ENCOUNTER — Other Ambulatory Visit: Payer: Self-pay

## 2021-02-27 MED ORDER — ETONOGESTREL-ETHINYL ESTRADIOL 0.12-0.015 MG/24HR VA RING: VAGINAL_RING | VAGINAL | 4 refills | Status: AC

## 2021-08-06 DIAGNOSIS — K625 Hemorrhage of anus and rectum: Secondary | ICD-10-CM | POA: Insufficient documentation

## 2021-08-06 DIAGNOSIS — K529 Noninfective gastroenteritis and colitis, unspecified: Secondary | ICD-10-CM | POA: Insufficient documentation

## 2021-08-27 DIAGNOSIS — E538 Deficiency of other specified B group vitamins: Secondary | ICD-10-CM | POA: Insufficient documentation

## 2021-09-05 ENCOUNTER — Encounter: Payer: BC Managed Care – PPO | Admitting: Obstetrics

## 2021-09-05 IMAGING — US US ABDOMEN LIMITED
1 series · 14 of 25 positions shown · non-contrast
Comparison: None.

CLINICAL DATA: Right upper quadrant pain.

EXAM:
ULTRASOUND ABDOMEN LIMITED RIGHT UPPER QUADRANT

[Series 1: us abdomen limited · 0.22mm/px · 14 of 48 slices shown]
[im 1/48]
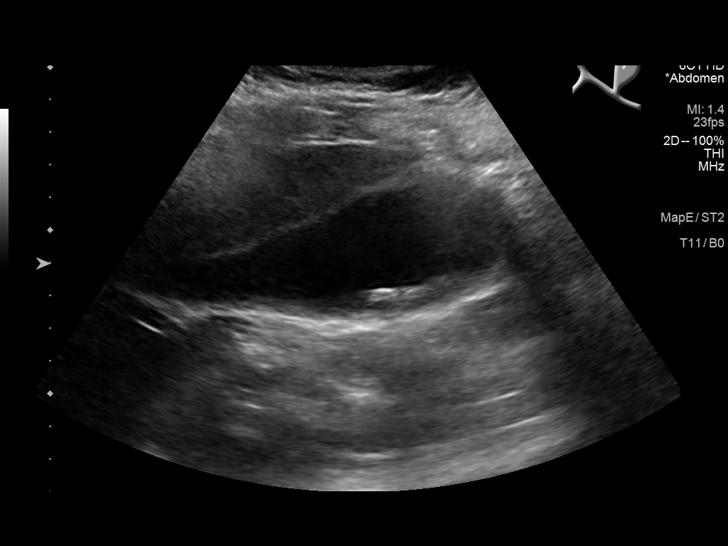
[im 4/48]
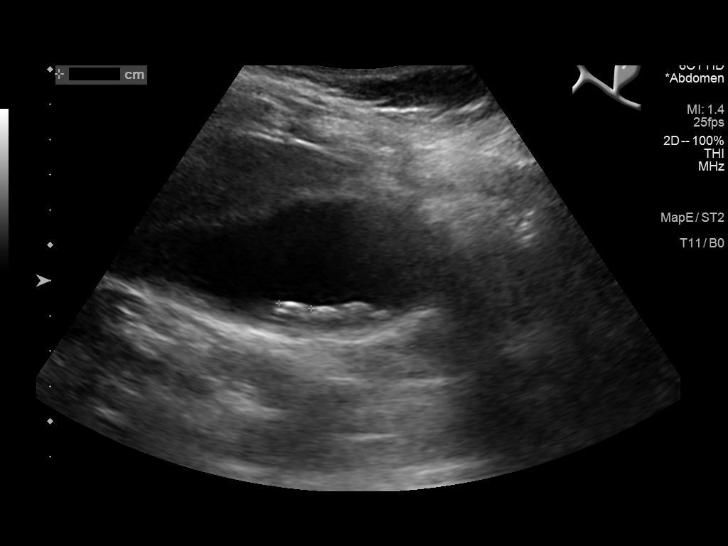
[im 8/48]
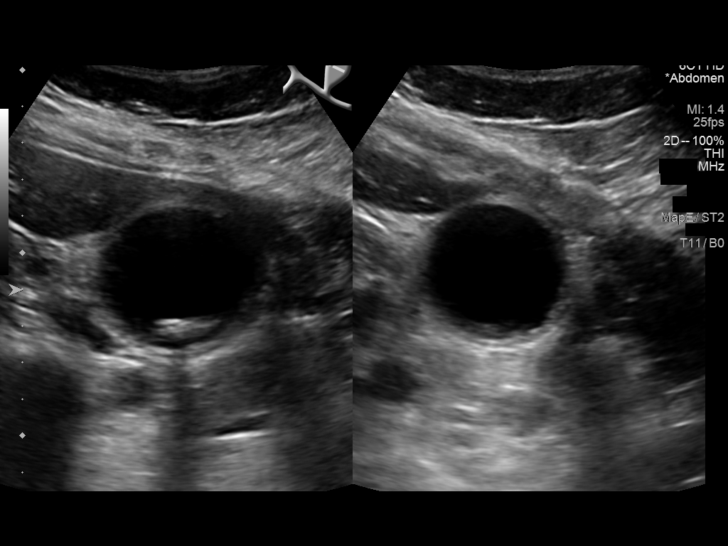
[im 12/48]
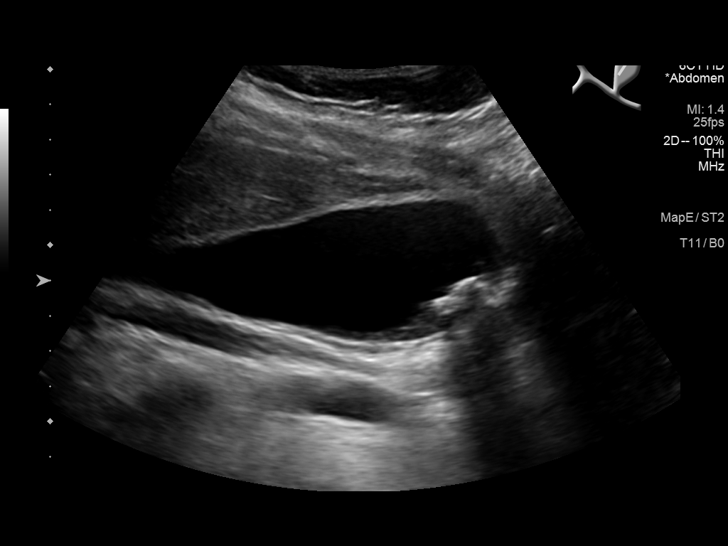
[im 16/48]
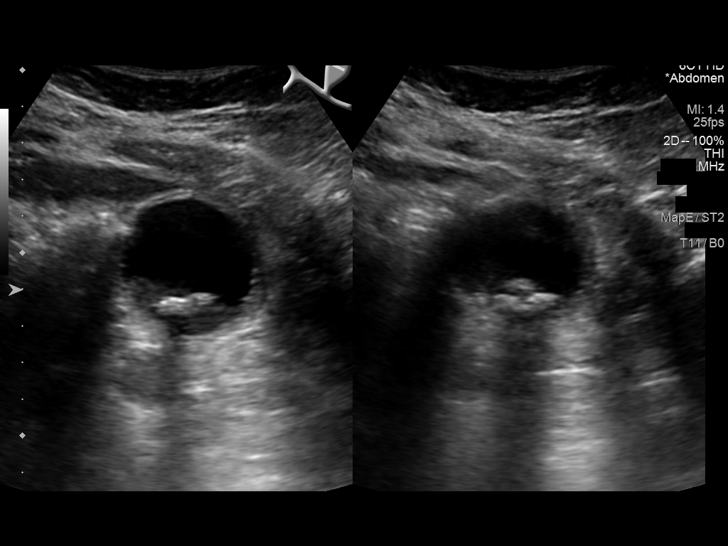
[im 18/48]
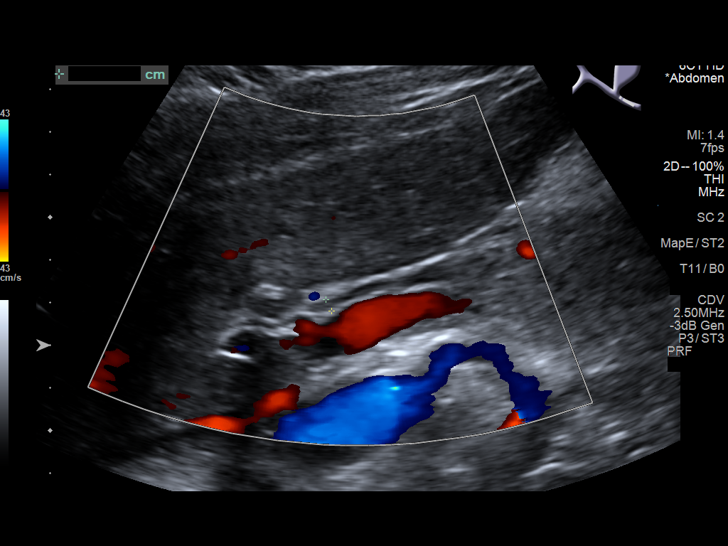
[im 22/48]
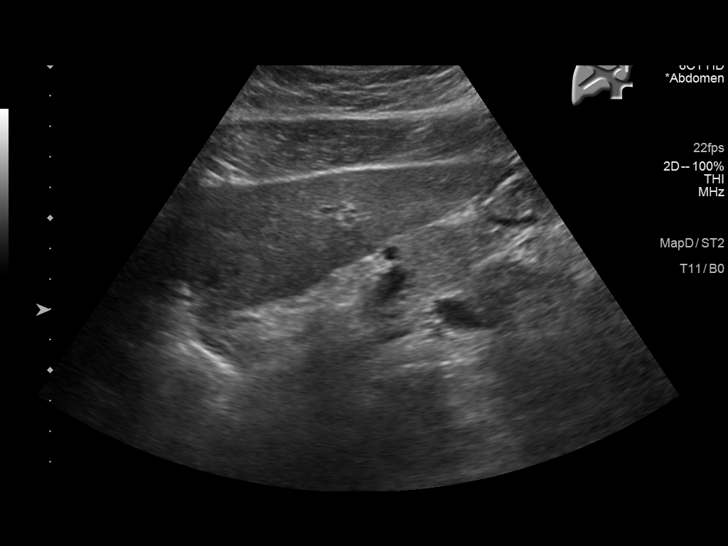
[im 26/48]
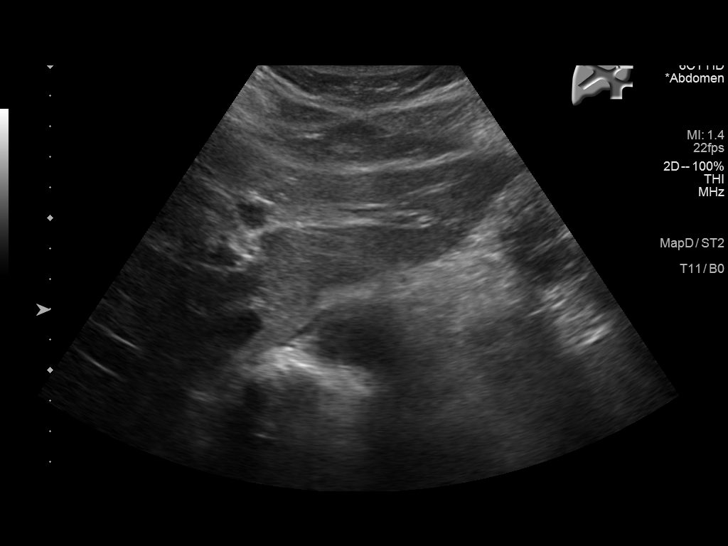
[im 30/48]
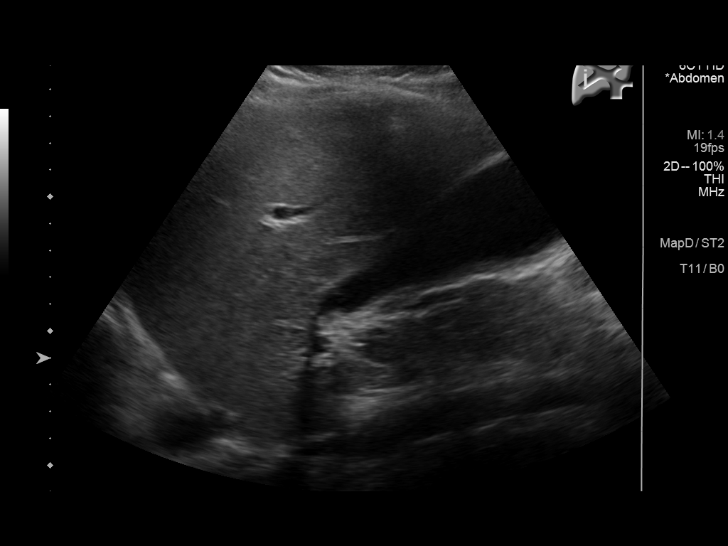
[im 32/48]
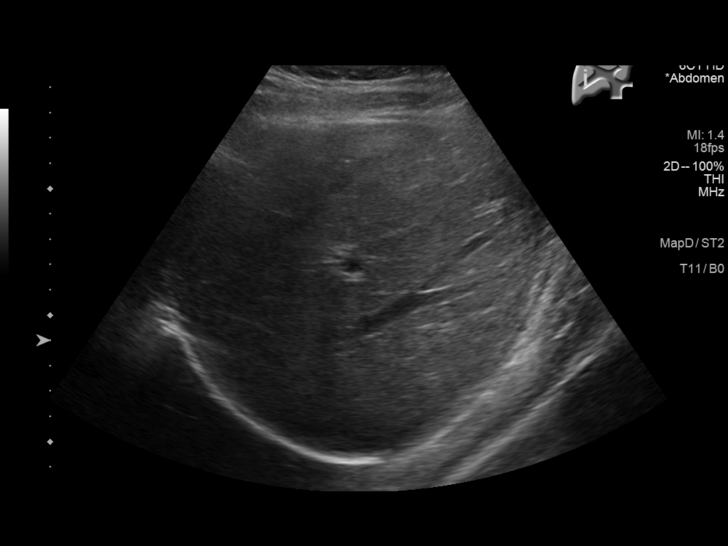
[im 36/48]
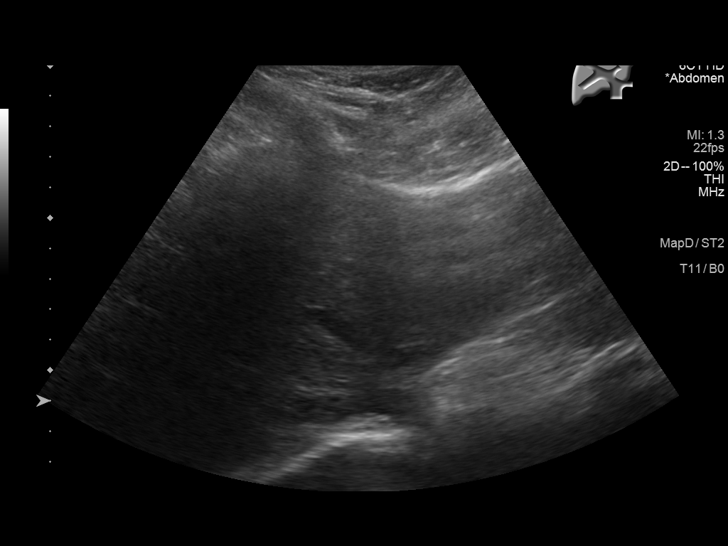
[im 40/48]
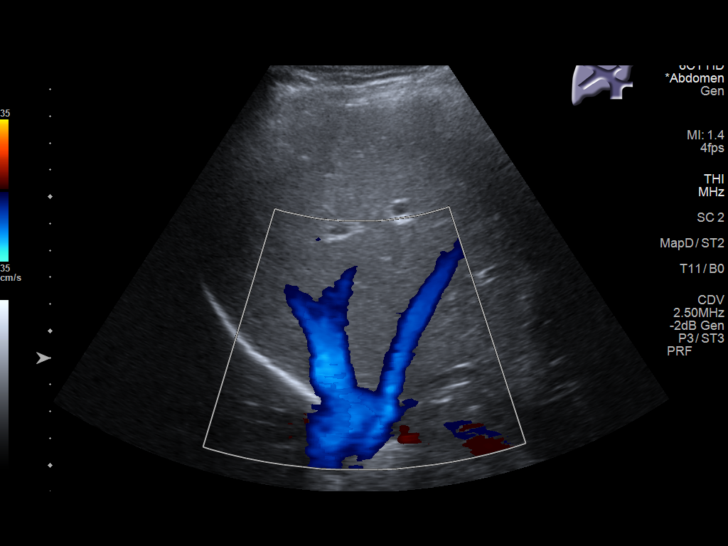
[im 44/48]
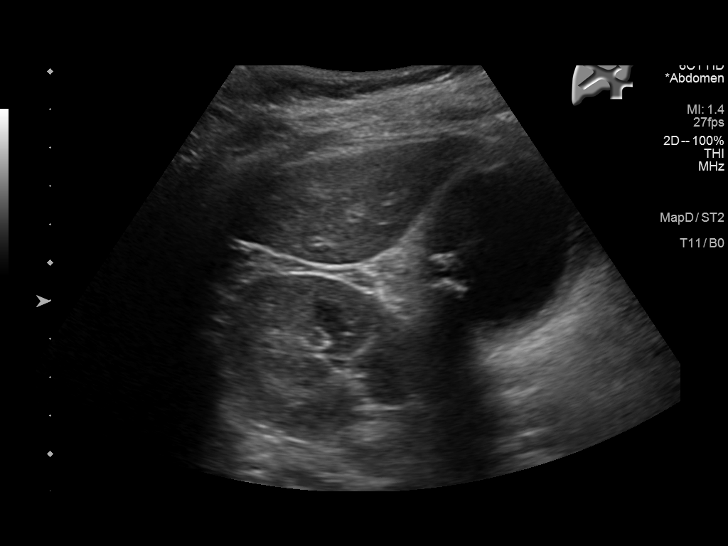
[im 48/48]
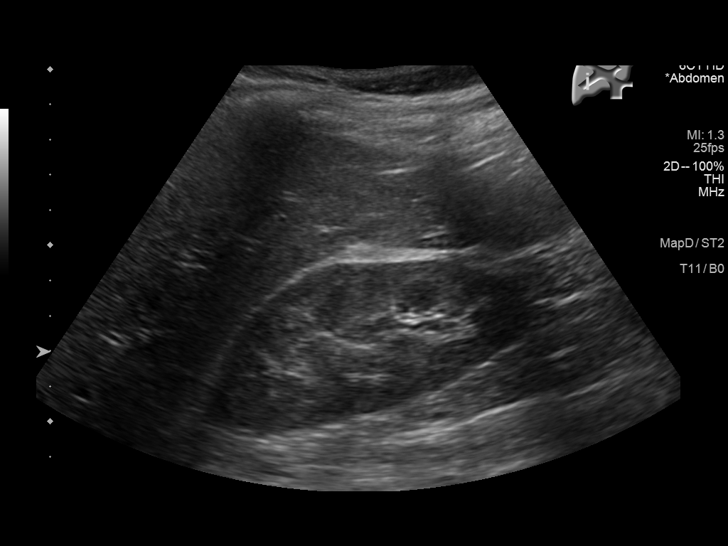

[14 of 25 positions shown; findings below may reference images not displayed]

FINDINGS: Gallbladder:

No wall thickening visualized. Mobile intraluminal stones measuring
up to 9 mm with sludge. No sonographic Murphy sign noted by
sonographer.

Common bile duct:

Diameter: 3.2 mm

Liver:

No focal lesion identified. Within normal limits in parenchymal
echogenicity. Portal vein is patent on color Doppler imaging with
normal direction of blood flow towards the liver.

Other: None.
IMPRESSION: Cholelithiasis without cholecystitis.

## 2021-09-16 ENCOUNTER — Encounter: Payer: BC Managed Care – PPO | Admitting: Obstetrics

## 2021-10-17 DIAGNOSIS — J309 Allergic rhinitis, unspecified: Secondary | ICD-10-CM | POA: Insufficient documentation

## 2021-10-17 DIAGNOSIS — K120 Recurrent oral aphthae: Secondary | ICD-10-CM | POA: Insufficient documentation

## 2021-12-10 ENCOUNTER — Other Ambulatory Visit (HOSPITAL_COMMUNITY): Payer: Self-pay | Admitting: Physician Assistant

## 2021-12-10 ENCOUNTER — Other Ambulatory Visit: Payer: Self-pay | Admitting: Physician Assistant

## 2021-12-10 DIAGNOSIS — R202 Paresthesia of skin: Secondary | ICD-10-CM

## 2021-12-10 DIAGNOSIS — R4189 Other symptoms and signs involving cognitive functions and awareness: Secondary | ICD-10-CM

## 2021-12-10 DIAGNOSIS — R2689 Other abnormalities of gait and mobility: Secondary | ICD-10-CM

## 2021-12-12 ENCOUNTER — Other Ambulatory Visit: Payer: Self-pay | Admitting: Physician Assistant

## 2021-12-12 DIAGNOSIS — R202 Paresthesia of skin: Secondary | ICD-10-CM

## 2021-12-12 DIAGNOSIS — L819 Disorder of pigmentation, unspecified: Secondary | ICD-10-CM

## 2021-12-12 DIAGNOSIS — R200 Anesthesia of skin: Secondary | ICD-10-CM

## 2021-12-12 DIAGNOSIS — R29898 Other symptoms and signs involving the musculoskeletal system: Secondary | ICD-10-CM

## 2021-12-17 ENCOUNTER — Ambulatory Visit: Payer: BC Managed Care – PPO

## 2021-12-24 ENCOUNTER — Ambulatory Visit
Admission: RE | Admit: 2021-12-24 | Discharge: 2021-12-24 | Disposition: A | Discharge status: Home / Self Care | Payer: BC Managed Care – PPO | Source: Ambulatory Visit | Attending: Physician Assistant | Admitting: Physician Assistant

## 2021-12-24 ENCOUNTER — Other Ambulatory Visit: Payer: Self-pay | Admitting: Physician Assistant

## 2021-12-24 DIAGNOSIS — L819 Disorder of pigmentation, unspecified: Secondary | ICD-10-CM | POA: Insufficient documentation

## 2021-12-24 DIAGNOSIS — R200 Anesthesia of skin: Secondary | ICD-10-CM

## 2021-12-24 DIAGNOSIS — R202 Paresthesia of skin: Secondary | ICD-10-CM

## 2021-12-24 DIAGNOSIS — R29898 Other symptoms and signs involving the musculoskeletal system: Secondary | ICD-10-CM | POA: Insufficient documentation

## 2021-12-26 ENCOUNTER — Ambulatory Visit
Admission: RE | Admit: 2021-12-26 | Discharge: 2021-12-26 | Disposition: A | Discharge status: Home / Self Care | Payer: BC Managed Care – PPO | Source: Ambulatory Visit | Attending: Physician Assistant | Admitting: Physician Assistant

## 2021-12-26 DIAGNOSIS — R202 Paresthesia of skin: Secondary | ICD-10-CM

## 2021-12-26 DIAGNOSIS — R2689 Other abnormalities of gait and mobility: Secondary | ICD-10-CM

## 2021-12-26 DIAGNOSIS — R4189 Other symptoms and signs involving cognitive functions and awareness: Secondary | ICD-10-CM

## 2021-12-26 MED ORDER — GADOBENATE DIMEGLUMINE 529 MG/ML IV SOLN
13.0000 mL | Freq: Once | INTRAVENOUS | Status: AC | PRN
  Administered 2021-12-26: 13 mL via INTRAVENOUS

## 2022-01-09 ENCOUNTER — Other Ambulatory Visit: Payer: Self-pay | Admitting: Physician Assistant

## 2022-01-09 ENCOUNTER — Other Ambulatory Visit (HOSPITAL_COMMUNITY): Payer: Self-pay | Admitting: Physician Assistant

## 2022-01-09 DIAGNOSIS — R202 Paresthesia of skin: Secondary | ICD-10-CM

## 2022-01-09 DIAGNOSIS — L819 Disorder of pigmentation, unspecified: Secondary | ICD-10-CM

## 2022-01-09 DIAGNOSIS — R200 Anesthesia of skin: Secondary | ICD-10-CM

## 2022-02-18 ENCOUNTER — Other Ambulatory Visit
Admission: RE | Admit: 2022-02-18 | Discharge: 2022-02-18 | Disposition: A | Discharge status: Home / Self Care | Payer: BC Managed Care – PPO | Source: Ambulatory Visit | Attending: Student | Admitting: Student

## 2022-02-18 DIAGNOSIS — R202 Paresthesia of skin: Secondary | ICD-10-CM | POA: Insufficient documentation

## 2022-02-18 LAB — D-DIMER, QUANTITATIVE: D-Dimer, Quant: <0.27 ug/mL-FEU (ref 0.00–0.50)

## 2022-02-19 ENCOUNTER — Emergency Department
Admission: EM | Admit: 2022-02-19 | Discharge: 2022-02-19 | Disposition: A | Discharge status: Home / Self Care | Payer: BC Managed Care – PPO | Attending: Emergency Medicine | Admitting: Emergency Medicine

## 2022-02-19 ENCOUNTER — Other Ambulatory Visit: Payer: Self-pay

## 2022-02-19 ENCOUNTER — Emergency Department: Payer: BC Managed Care – PPO

## 2022-02-19 ENCOUNTER — Encounter: Payer: Self-pay | Admitting: Emergency Medicine

## 2022-02-19 DIAGNOSIS — R0789 Other chest pain: Secondary | ICD-10-CM | POA: Insufficient documentation

## 2022-02-19 DIAGNOSIS — R2991 Unspecified symptoms and signs involving the musculoskeletal system: Secondary | ICD-10-CM | POA: Insufficient documentation

## 2022-02-19 DIAGNOSIS — R42 Dizziness and giddiness: Secondary | ICD-10-CM | POA: Insufficient documentation

## 2022-02-19 DIAGNOSIS — E876 Hypokalemia: Secondary | ICD-10-CM | POA: Diagnosis not present

## 2022-02-19 DIAGNOSIS — R079 Chest pain, unspecified: Secondary | ICD-10-CM

## 2022-02-19 DIAGNOSIS — R000 Tachycardia, unspecified: Secondary | ICD-10-CM | POA: Diagnosis not present

## 2022-02-19 DIAGNOSIS — F419 Anxiety disorder, unspecified: Secondary | ICD-10-CM | POA: Diagnosis not present

## 2022-02-19 LAB — COMPREHENSIVE METABOLIC PANEL
ALT: 14 U/L (ref 0–44)
AST: 17 U/L (ref 15–41)
Albumin: 3.7 g/dL (ref 3.5–5.0)
Alkaline Phosphatase: 31 U/L — ABNORMAL LOW (ref 38–126)
Anion gap: 9 (ref 5–15)
BUN: 20 mg/dL (ref 6–20)
CO2: 23 mmol/L (ref 22–32)
Calcium: 9.3 mg/dL (ref 8.9–10.3)
Chloride: 109 mmol/L (ref 98–111)
Creatinine, Ser: 0.86 mg/dL (ref 0.44–1.00)
GFR, Estimated: >60 mL/min (ref >60)
Glucose, Bld: 131 mg/dL — ABNORMAL HIGH (ref 70–99)
Potassium: 3.3 mmol/L — ABNORMAL LOW (ref 3.5–5.1)
Sodium: 141 mmol/L (ref 135–145)
Total Bilirubin: 0.6 mg/dL (ref 0.3–1.2)
Total Protein: 7.1 g/dL (ref 6.5–8.1)

## 2022-02-19 LAB — TSH: TSH: 5.286 u[IU]/mL — ABNORMAL HIGH (ref 0.350–4.500)

## 2022-02-19 LAB — TROPONIN I (HIGH SENSITIVITY)
Troponin I (High Sensitivity): <2 ng/L (ref <18)
Troponin I (High Sensitivity): <2 ng/L (ref <18)

## 2022-02-19 LAB — CBC WITH DIFFERENTIAL/PLATELET
Abs Immature Granulocytes: 0.02 10*3/uL (ref 0.00–0.07)
Basophils Absolute: 0 10*3/uL (ref 0.0–0.1)
Basophils Relative: 0 %
Eosinophils Absolute: 0 10*3/uL (ref 0.0–0.5)
Eosinophils Relative: 1 %
HCT: 39.3 % (ref 36.0–46.0)
Hemoglobin: 12.9 g/dL (ref 12.0–15.0)
Immature Granulocytes: 0 %
Lymphocytes Relative: 37 %
Lymphs Abs: 2.3 10*3/uL (ref 0.7–4.0)
MCH: 29.9 pg (ref 26.0–34.0)
MCHC: 32.8 g/dL (ref 30.0–36.0)
MCV: 91 fL (ref 80.0–100.0)
Monocytes Absolute: 0.5 10*3/uL (ref 0.1–1.0)
Monocytes Relative: 8 %
Neutro Abs: 3.3 10*3/uL (ref 1.7–7.7)
Neutrophils Relative %: 54 %
Platelets: 289 10*3/uL (ref 150–400)
RBC: 4.32 MIL/uL (ref 3.87–5.11)
RDW: 12.3 % (ref 11.5–15.5)
WBC: 6.2 10*3/uL (ref 4.0–10.5)
nRBC: 0 % (ref 0.0–0.2)

## 2022-02-19 LAB — LIPASE, BLOOD: Lipase: 35 U/L (ref 11–51)

## 2022-02-19 LAB — T4, FREE: Free T4: 0.93 ng/dL (ref 0.61–1.12)

## 2022-02-19 LAB — D-DIMER, QUANTITATIVE: D-Dimer, Quant: <0.27 ug/mL-FEU (ref 0.00–0.50)

## 2022-02-19 MED ORDER — KETOROLAC TROMETHAMINE 30 MG/ML IJ SOLN
15.0000 mg | Freq: Once | INTRAMUSCULAR | Status: AC
  Administered 2022-02-19: 15 mg via INTRAVENOUS
  Filled 2022-02-19: qty 1

## 2022-02-19 MED ORDER — SODIUM CHLORIDE 0.9 % IV BOLUS
1000.0000 mL | Freq: Once | INTRAVENOUS | Status: AC
  Administered 2022-02-19: 1000 mL via INTRAVENOUS

## 2022-02-19 NOTE — ED Notes (Signed)
Pt understood d/c instructions and had no questions at this time. Pt left with significant other with d/c papers.

## 2022-02-19 NOTE — ED Triage Notes (Signed)
Patient ambulatory to triage with steady gait, without difficulty or distress noted; pt reports awakening at 3am with "tightness" in rt arm and upper chest accomp by dizziness; denies hx of same

## 2022-02-19 NOTE — ED Provider Notes (Signed)
[facility] Provider Note    Event Date/Time   First MD Initiated Contact with Patient 02/19/22 [phone]     (approximate)   History   Chest Pain   HPI  Olivia Gillespie is a 26 y.o. female who presents to the ED from home with a chief complaint of chest pain.  Reports awakening around 3 AM with tightness in her central lower right chest area associated with dizziness and tightness in her right arm.  Denies associated diaphoresis, shortness of breath, palpitations, nausea or vomiting.  Has had intermittent tightening and discoloration issues in her right arm which has been evaluated by PT, neurology and currently has a referral for vascular surgery.  Notes tightness in right leg.  Denies recent fever, cough, abdominal pain.  Denies recent travel or trauma.  Has NuvaRing.     Past Medical History   Past Medical History:  Diagnosis Date   Acne    Anxiety    Bacterial vaginitis    Depression    GERD (gastroesophageal reflux disease)    Headache    Hyperlipidemia    Insomnia    PCOS (polycystic ovarian syndrome)    Seasonal allergies      Active Problem List   Patient Active Problem List   Diagnosis Date Noted   PCOS (polycystic ovarian syndrome) 07/31/2016     Past Surgical History   Past Surgical History:  Procedure Laterality Date   CHOLECYSTECTOMY     WISDOM TOOTH EXTRACTION  2013     Home Medications   Prior to Admission medications   Medication Sig Start Date End Date Taking? Authorizing Provider  Acetaminophen-Caffeine (EXCEDRIN TENSION HEADACHE) 500-65 MG TABS Take 3 tablets by mouth 2 (two) times daily as needed (headache).    [provider]  buPROPion (WELLBUTRIN XL) 150 MG 24 hr tablet Take 150 mg by mouth daily. 01/17/20   [provider]  etonogestrel-ethinyl estradiol (NUVARING) 0.12-0.015 MG/24HR vaginal ring Insert vaginally and leave in place for 3 consecutive weeks, then remove for 1 week. 02/27/21    Doreene Burke, CNM  fluticasone (FLONASE) 50 MCG/ACT nasal spray Place 2 sprays into both nostrils daily as needed for allergies or rhinitis.    [provider]  ibuprofen (ADVIL) 800 MG tablet Take 1 tablet (800 mg total) by mouth every 8 (eight) hours as needed for mild pain or moderate pain. 03/08/20   Tonna Boehringer, Isami, DO  loratadine (CLARITIN) 10 MG tablet Take 10 mg by mouth daily. 02/06/20   [provider]  melatonin 5 MG TABS Take 5 mg by mouth at bedtime.     [provider]  spironolactone (ALDACTONE) 100 MG tablet TAKE 2 TABLETS BY MOUTH DAILY 09/17/20   Lawhorn, Vanessa [place], CNM     Allergies  Patient has no known allergies.   Family History   Family History  Problem Relation Age of Onset   Hypertension Mother    Mental illness Maternal Grandmother    Hyperlipidemia Paternal Grandfather    Diabetes Paternal Grandfather    Breast cancer Neg Hx    Ovarian cancer Neg Hx    Colon cancer Neg Hx      Physical Exam  Triage Vital Signs: ED Triage Vitals [02/19/22 0433]  Enc Vitals Group     BP      Pulse      Resp      Temp      Temp src      SpO2  Weight 153 lb (69.4 kg)     Height 5\' 6"  (1.676 m)     Head Circumference      Peak Flow      Pain Score 6     Pain Loc      Pain Edu?      Excl. in [place]?     Updated Vital Signs: BP 115/76   Pulse 82   Temp 98 F (36.7 C) (Oral)   Resp 14   Ht 5\' 6"  (1.676 m)   Wt 69.4 kg   LMP 02/11/2022 (Exact Date)   SpO2 99%   BMI 24.69 kg/m    General: Awake, mild distress.  CV:  Mildly tachycardic.  Good peripheral perfusion.  Resp:  Normal effort.  CTA B.  Mild tenderness to palpation xiphoid process. Abd:  Nontender.  No distention.  Other:  Mildly anxious.  No thyromegaly.  Bilateral calves are supple, not tender and nonswollen.   ED Results / Procedures / Treatments  Labs (all labs ordered are listed, but only abnormal results are displayed) Labs Reviewed  COMPREHENSIVE  METABOLIC PANEL - Abnormal; Notable for the following components:      Result Value   Potassium 3.3 (*)    Glucose, Bld 131 (*)    Alkaline Phosphatase 31 (*)    All other components within normal limits  TSH - Abnormal; Notable for the following components:   TSH 5.286 (*)    All other components within normal limits  CBC WITH DIFFERENTIAL/PLATELET  LIPASE, BLOOD  D-DIMER, QUANTITATIVE  T4, FREE  TROPONIN I (HIGH SENSITIVITY)  TROPONIN I (HIGH SENSITIVITY)     EKG  ED ECG REPORT I, Stephen Baruch J, the attending physician, personally viewed and interpreted this ECG.   Date: 02/19/2022  EKG Time: 0439  Rate: 101  Rhythm: sinus tachycardia  Axis: Normal  Intervals:none  ST&T Change: Nonspecific    RADIOLOGY I have independently visualized and interpreted patient's chest x-ray and ultrasound as well as noted the radiology interpretation:  Chest x-ray: No acute cardiopulmonary process  DVT ultrasound: No DVT  Official radiology report(s): US Venous Img Lower Unilateral Right (DVT)  Result Date: 02/19/2022 CLINICAL DATA:  Right leg pain EXAM: RIGHT LOWER EXTREMITY VENOUS DOPPLER ULTRASOUND TECHNIQUE: Gray-scale sonography with compression, as well as color and duplex ultrasound, were performed to evaluate the deep venous system(s) from the level of the common femoral vein through the popliteal and proximal calf veins. COMPARISON:  None Available. FINDINGS: VENOUS Normal compressibility of the common femoral, superficial femoral, and popliteal veins, as well as the visualized calf veins. Visualized portions of profunda femoral vein and great saphenous vein unremarkable. No filling defects to suggest DVT on grayscale or color Doppler imaging. Doppler waveforms show normal direction of venous flow, normal respiratory plasticity and response to augmentation. Limited views of the contralateral common femoral vein are unremarkable. IMPRESSION: Negative for DVT in the right lower  extremity. Electronically Signed   By: Jorje Guild M.D.   On: 02/19/2022 05:45   DG Chest Portable 1 View  Result Date: 02/19/2022 CLINICAL DATA:  26 year old female with history of chest pain. EXAM: PORTABLE CHEST 1 VIEW COMPARISON:  No priors. FINDINGS: Lung volumes are normal. No consolidative airspace disease. No pleural effusions. No pneumothorax. No pulmonary nodule or mass noted. Pulmonary vasculature and the cardiomediastinal silhouette are within normal limits. IMPRESSION: No radiographic evidence of acute cardiopulmonary disease. Electronically Signed   By: Vinnie Langton M.D.   On: 02/19/2022 05:02  PROCEDURES:  Critical Care performed: No  .1-3 Lead EKG Interpretation  Performed by: Paulette Blanch, MD Authorized by: Paulette Blanch, MD     Interpretation: abnormal     ECG rate:  110   ECG rate assessment: tachycardic     Rhythm: sinus tachycardia     Ectopy: none     Conduction: normal   Comments:     Patient placed on cardiac monitor to evaluate for arrhythmias    MEDICATIONS ORDERED IN ED: Medications  ketorolac (TORADOL) 30 MG/ML injection 15 mg (15 mg Intravenous Given 02/19/22 0512)  sodium chloride 0.9 % bolus 1,000 mL (0 mLs Intravenous Stopped 02/19/22 0648)     IMPRESSION / MDM / ASSESSMENT AND PLAN / ED COURSE  I reviewed the triage vital signs and the nursing notes.                             26 year old female presenting with chest pain. Differential diagnosis includes, but is not limited to, ACS, aortic dissection, pulmonary embolism, cardiac tamponade, pneumothorax, pneumonia, pericarditis, myocarditis, GI-related causes including esophagitis/gastritis, and musculoskeletal chest wall pain.   I have personally reviewed patient's records and note her neurology office visit from 01/06/2022 for right arm paresthesias.  Patient's presentation is most consistent with acute presentation with potential threat to life or bodily function.  The patient is  on the cardiac monitor to evaluate for evidence of arrhythmia and/or significant heart rate changes.  We will obtain cardiac panel including LFTs/lipase, D-dimer, thyroid studies.  Obtain chest x-ray, right lower leg DVT ultrasound.  Administer IV fluids, IV Ketorolac and reassess.  Clinical Course as of 02/19/22 0701  Wed Feb 19, 2022  0626 Patient resting in no acute distress, heart rate improved.  Pain improved after IV ketorolac.  Updated patient and family member of laboratory and imaging results thus far.  WBC 6.2, mild hypokalemia with potassium 3.3, negative D-dimer, thyroid panel unremarkable, negative initial troponin.  Chest x-ray and DVT ultrasound unremarkable.  At this time we are awaiting repeat troponin. [JS]  0700 Care transferred to Dr. Jori Moll at change of shift pending repeat troponin. Anticipate charge home with cardiology referral.  Strict return precautions given.  Patient and family member verbalized understanding agree with plan of care. [JS]    Clinical Course User Index [JS] Paulette Blanch, MD     FINAL CLINICAL IMPRESSION(S) / ED DIAGNOSES   Final diagnoses:  Nonspecific chest pain     Rx / DC Orders   ED Discharge Orders     None        Note:  This document was prepared using Dragon voice recognition software and may include unintentional dictation errors.   Paulette Blanch, MD 02/19/22 [phone]

## 2022-02-19 NOTE — Discharge Instructions (Addendum)
You may take Tylenol and/or Ibuprofen as needed for pain.  Apply moist heat to affected area several times daily.  Return to the ER for worsening symptoms, persistent vomiting, difficulty breathing or other concerns.

## 2022-02-24 ENCOUNTER — Ambulatory Visit
Admission: RE | Admit: 2022-02-24 | Discharge: 2022-02-24 | Disposition: A | Discharge status: Home / Self Care | Payer: BC Managed Care – PPO | Source: Ambulatory Visit | Attending: Physician Assistant | Admitting: Physician Assistant

## 2022-02-24 DIAGNOSIS — R200 Anesthesia of skin: Secondary | ICD-10-CM | POA: Insufficient documentation

## 2022-02-24 DIAGNOSIS — R202 Paresthesia of skin: Secondary | ICD-10-CM | POA: Diagnosis present

## 2022-02-24 DIAGNOSIS — L819 Disorder of pigmentation, unspecified: Secondary | ICD-10-CM | POA: Insufficient documentation

## 2022-03-11 ENCOUNTER — Other Ambulatory Visit: Payer: Self-pay | Admitting: Physician Assistant

## 2022-03-11 DIAGNOSIS — R200 Anesthesia of skin: Secondary | ICD-10-CM

## 2022-03-11 DIAGNOSIS — M542 Cervicalgia: Secondary | ICD-10-CM

## 2022-03-19 ENCOUNTER — Other Ambulatory Visit: Payer: BC Managed Care – PPO

## 2022-03-21 ENCOUNTER — Other Ambulatory Visit: Payer: BC Managed Care – PPO

## 2022-03-25 ENCOUNTER — Ambulatory Visit
Admission: RE | Admit: 2022-03-25 | Discharge: 2022-03-25 | Disposition: A | Discharge status: Home / Self Care | Payer: BC Managed Care – PPO | Source: Ambulatory Visit | Attending: Physician Assistant | Admitting: Physician Assistant

## 2022-03-25 DIAGNOSIS — M542 Cervicalgia: Secondary | ICD-10-CM

## 2022-03-25 DIAGNOSIS — R200 Anesthesia of skin: Secondary | ICD-10-CM

## 2022-03-28 ENCOUNTER — Other Ambulatory Visit: Payer: BC Managed Care – PPO

## 2022-04-08 ENCOUNTER — Other Ambulatory Visit (INDEPENDENT_AMBULATORY_CARE_PROVIDER_SITE_OTHER): Payer: Self-pay | Admitting: Vascular Surgery

## 2022-04-08 DIAGNOSIS — R202 Paresthesia of skin: Secondary | ICD-10-CM

## 2022-04-08 DIAGNOSIS — R200 Anesthesia of skin: Secondary | ICD-10-CM

## 2022-04-13 DIAGNOSIS — I7300 Raynaud's syndrome without gangrene: Secondary | ICD-10-CM | POA: Insufficient documentation

## 2022-04-13 DIAGNOSIS — I872 Venous insufficiency (chronic) (peripheral): Secondary | ICD-10-CM | POA: Insufficient documentation

## 2022-04-13 NOTE — Progress Notes (Signed)
MRN : [id number]  Olivia Gillespie is a 27 y.o. (01-29-1996) female who presents with chief complaint of legs hurt and swell.  History of Present Illness:     Location: Right arm (also right leg but less frequent and different) Character/quality of the symptom:  pain associated with a discoloration Severity:  moderate to severe Duration:  on going for months Timing/onset:  seems variable Aggravating/context:  Associated with exercise or activity Relieving/modifying:  time seems to come in waves and fade with time.  No history of trauma, has done some exercise/weight lifting but doesn't seem excessive.  No Armed forces operational officer.  Noninvasive study is normal with the exception that her pulse is eliminated with hyper abduction  No outpatient medications have been marked as taking for the 04/14/22 encounter (Appointment) with Gilda Crease, Latina Craver, MD.    Past Medical History:  Diagnosis Date   Acne    Anxiety    Bacterial vaginitis    Depression    GERD (gastroesophageal reflux disease)    Headache    Hyperlipidemia    Insomnia    PCOS (polycystic ovarian syndrome)    Seasonal allergies     Past Surgical History:  Procedure Laterality Date   CHOLECYSTECTOMY     WISDOM TOOTH EXTRACTION  2013    Social History Social History   Tobacco Use   Smoking status: Never   Smokeless tobacco: Never  Vaping Use   Vaping Use: Never used  Substance Use Topics   Alcohol use: No   Drug use: No    Family History Family History  Problem Relation Age of Onset   Hypertension Mother    Mental illness Maternal Grandmother    Hyperlipidemia Paternal Grandfather    Diabetes Paternal Grandfather    Breast cancer Neg Hx    Ovarian cancer Neg Hx    Colon cancer Neg Hx     No Known Allergies   REVIEW OF SYSTEMS (Negative unless checked)  Constitutional: [] Weight loss  [] Fever  [] Chills Cardiac: [] Chest pain   [] Chest pressure   [] Palpitations   [] Shortness of breath when laying  flat   [] Shortness of breath with exertion. Vascular:  [] Pain in legs with walking   [x] Pain in legs at rest  [] History of DVT   [] Phlebitis   [x] Swelling in legs   [] Varicose veins   [] Non-healing ulcers Pulmonary:   [] Uses home oxygen   [] Productive cough   [] Hemoptysis   [] Wheeze  [] COPD   [] Asthma Neurologic:  [] Dizziness   [] Seizures   [] History of stroke   [] History of TIA  [] Aphasia   [] Vissual changes   [] Weakness or numbness in arm   [] Weakness or numbness in leg Musculoskeletal:   [] Joint swelling   [] Joint pain   [] Low back pain Hematologic:  [] Easy bruising  [] Easy bleeding   [] Hypercoagulable state   [] Anemic Gastrointestinal:  [] Diarrhea   [] Vomiting  [] Gastroesophageal reflux/heartburn   [] Difficulty swallowing. Genitourinary:  [] Chronic kidney disease   [] Difficult urination  [] Frequent urination   [] Blood in urine Skin:  [] Rashes   [] Ulcers  Psychological:  [] History of anxiety   []  History of major depression.  Physical Examination  There were no vitals filed for this visit. There is no height or weight on file to calculate BMI. Gen: WD/WN, NAD Head: Circle/AT, No temporalis wasting.  Ear/Nose/Throat: Hearing grossly intact, nares w/o erythema or drainage, pinna without lesions Eyes: PER, EOMI, sclera nonicteric.  Neck: Supple, no  gross masses.  No JVD.  Pulmonary:  Good air movement, no audible wheezing, no use of accessory muscles.  Cardiac: RRR, precordium not hyperdynamic. Vascular:   pulse is eliminated with hyper abduction Vessel Right Left  Radial Palpable Palpable  Gastrointestinal: soft, non-distended. No guarding/no peritoneal signs.  Musculoskeletal: M/S 5/5 throughout.  No deformity.  Neurologic: CN 2-12 intact. Pain and light touch intact in extremities.  Symmetrical.  Speech is fluent. Motor exam as listed above. Psychiatric: Judgment intact, Mood & affect appropriate for pt's clinical situation. Dermatologic: Venous rashes no ulcers noted.  No changes  consistent with cellulitis. Lymph : No lichenification or skin changes of chronic lymphedema.  CBC Lab Results  Component Value Date   WBC 6.2 02/19/2022   HGB 12.9 02/19/2022   HCT 39.3 02/19/2022   MCV 91.0 02/19/2022   PLT 289 02/19/2022    BMET    Component Value Date/Time   NA 141 02/19/2022 0446   NA 141 08/17/2020 1614   K 3.3 (L) 02/19/2022 0446   CL 109 02/19/2022 0446   CO2 23 02/19/2022 0446   GLUCOSE 131 (H) 02/19/2022 0446   BUN 20 02/19/2022 0446   BUN 9 08/17/2020 1614   CREATININE 0.86 02/19/2022 0446   CALCIUM 9.3 02/19/2022 0446   GFRNONAA >60 02/19/2022 0446   GFRAA 117 10/25/2018 1131   CrCl cannot be calculated (Patient's most recent lab result is older than the maximum 21 days allowed.).  COAG No results found for: "INR", "PROTIME"  Radiology MR CERVICAL SPINE WO CONTRAST  Result Date: 03/25/2022 CLINICAL DATA:  Provided history: Cervicalgia. Numbness and tingling. Additional history provided by scanning technologist: the patient reports right arm and right leg paresthesias for 7 months, as well as "sharp zaps" of pain. EXAM: MRI CERVICAL SPINE WITHOUT CONTRAST TECHNIQUE: Multiplanar, multisequence MR imaging of the cervical spine was performed. No intravenous contrast was administered. COMPARISON:  No pertinent prior exams available for comparison. FINDINGS: Alignment: Straightening of the expected cervical lordosis. No significant spondylolisthesis. Vertebrae: Vertebral body height is maintained. No significant marrow edema or focal suspicious osseous lesion. Cord: No signal abnormality identified within the cervical spinal cord. Posterior Fossa, vertebral arteries, paraspinal tissues: Retrocerebellar CSF intensity prominence at midline, measuring 14 mm in AP dimension. This may reflect a mega cisterna magna or posterior fossa arachnoid cyst. Flow voids preserved within the imaged cervical vertebral arteries. No paraspinal mass or collection. Disc levels:  Mild multilevel disc degeneration. C2-C3: No significant disc herniation or stenosis. C3-C4: A small central disc protrusion focally effaces the ventral thecal sac, and contacts the ventral aspect of the spinal cord. No significant foraminal stenosis. C4-C5: No significant disc herniation or stenosis. C5-C6: No significant disc herniation or stenosis. C6-C7: No significant disc herniation or stenosis. C7-T1: No significant disc herniation or stenosis. IMPRESSION: Mild cervical spondylosis, as outlined. Most notably at C3-C4, a small central disc protrusion focally effaces the ventral thecal sac and contacts the ventral aspect of the spinal cord. 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 Electronically Signed   By: Kellie Simmering D.O.   On: 03/25/2022 17:47     Assessment/Plan 1. 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

## 2022-04-14 ENCOUNTER — Ambulatory Visit (INDEPENDENT_AMBULATORY_CARE_PROVIDER_SITE_OTHER): Payer: BC Managed Care – PPO | Admitting: Vascular Surgery

## 2022-04-14 ENCOUNTER — Encounter (INDEPENDENT_AMBULATORY_CARE_PROVIDER_SITE_OTHER): Payer: Self-pay | Admitting: Vascular Surgery

## 2022-04-14 ENCOUNTER — Ambulatory Visit (INDEPENDENT_AMBULATORY_CARE_PROVIDER_SITE_OTHER): Payer: BC Managed Care – PPO

## 2022-04-14 VITALS — BP 141/92 | HR 101 | Resp 18 | Ht 66.0 in | Wt 149.0 lb

## 2022-04-14 DIAGNOSIS — G540 Brachial plexus disorders: Secondary | ICD-10-CM | POA: Diagnosis not present

## 2022-04-14 DIAGNOSIS — R202 Paresthesia of skin: Secondary | ICD-10-CM

## 2022-04-14 DIAGNOSIS — I7300 Raynaud's syndrome without gangrene: Secondary | ICD-10-CM

## 2022-04-14 DIAGNOSIS — M79601 Pain in right arm: Secondary | ICD-10-CM | POA: Diagnosis not present

## 2022-04-14 DIAGNOSIS — R200 Anesthesia of skin: Secondary | ICD-10-CM | POA: Diagnosis not present

## 2022-04-14 DIAGNOSIS — I872 Venous insufficiency (chronic) (peripheral): Secondary | ICD-10-CM

## 2022-04-18 ENCOUNTER — Encounter (INDEPENDENT_AMBULATORY_CARE_PROVIDER_SITE_OTHER): Payer: Self-pay | Admitting: Vascular Surgery

## 2022-04-18 DIAGNOSIS — G540 Brachial plexus disorders: Secondary | ICD-10-CM | POA: Insufficient documentation

## 2022-04-18 DIAGNOSIS — M79603 Pain in arm, unspecified: Secondary | ICD-10-CM | POA: Insufficient documentation

## 2022-04-21 ENCOUNTER — Ambulatory Visit: Payer: BC Managed Care – PPO | Admitting: Cardiovascular Disease

## 2022-04-21 ENCOUNTER — Telehealth (INDEPENDENT_AMBULATORY_CARE_PROVIDER_SITE_OTHER): Payer: Self-pay | Admitting: Vascular Surgery

## 2022-04-21 NOTE — Telephone Encounter (Signed)
Patient was made aware referral has been placed and she will call back if needed so

## 2022-04-21 NOTE — Telephone Encounter (Signed)
Patient called stating Dr. Delana Meyer is supposed to send a referral to Dr. Filiberto Pinks  in [place].  She states she have not gotten a call from them and was told to call to remind Dr. Delana Meyer if she didn't hear from them.  Please advise.

## 2022-04-24 ENCOUNTER — Other Ambulatory Visit (INDEPENDENT_AMBULATORY_CARE_PROVIDER_SITE_OTHER): Payer: Self-pay | Admitting: Vascular Surgery

## 2022-04-24 ENCOUNTER — Encounter (INDEPENDENT_AMBULATORY_CARE_PROVIDER_SITE_OTHER): Payer: Self-pay | Admitting: Vascular Surgery

## 2022-04-24 DIAGNOSIS — M79601 Pain in right arm: Secondary | ICD-10-CM

## 2022-04-25 ENCOUNTER — Other Ambulatory Visit: Payer: Self-pay | Admitting: Physician Assistant

## 2022-04-25 DIAGNOSIS — M542 Cervicalgia: Secondary | ICD-10-CM

## 2022-04-25 DIAGNOSIS — R200 Anesthesia of skin: Secondary | ICD-10-CM

## 2022-04-25 DIAGNOSIS — G8929 Other chronic pain: Secondary | ICD-10-CM

## 2022-04-28 NOTE — Progress Notes (Unsigned)
Referring Physician:  Wonda Cerise, PA-C [place],  [place] [address]  Primary Physician:  Wayland Denis, PA-C  History of Present Illness: 04/30/2022 [name] has a history of gastric ulcer, GERD, hyperlipidemia, PCOS.   Vascular has referred her to Dr. Adriana Mccallum for thoracic outlet syndrome. She sees neurology as well and they referred her for cervical spine.  She has intermittent neck pain that is more of a muscular soreness. She has intermittent flare ups of constant pain and tingling in both arms (entire arm mostly into hands). She notes some weakness when she has a flare up. 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   She has intermittent right leg pain (entire leg) with numbness and feeling of having a rubber band around her leg. No left leg symptoms. No weakness.   No bowel or bladder issues. She feels like she has some balance issues, but does not fall. No dexterity issues in hands unless she is having a flare up.   No tobacco use.    Conservative measures:  Physical therapy: for her posterior, starting PT for her neck on 05/15/22 at [facility]  Multimodal medical therapy including regular antiinflammatories: neurontin, prednisone, mobic Injections: No epidural steroid injections  Past Surgery: No spinal surgery.   [name] has no symptoms of cervical myelopathy.  The symptoms are causing a significant impact on the patient's life.   Review of Systems:  A 10 point review of systems is negative, except for the pertinent positives and negatives detailed in the HPI.  Past Medical History: Past Medical History:  Diagnosis Date   Acne    Anxiety    Bacterial vaginitis    Depression    GERD (gastroesophageal reflux disease)    Headache    Hyperlipidemia    Insomnia    PCOS (polycystic ovarian syndrome)    Seasonal allergies     Past Surgical History: Past Surgical History:  Procedure Laterality Date   CHOLECYSTECTOMY      WISDOM TOOTH EXTRACTION  2013    Allergies: Allergies as of 04/30/2022   (No Known Allergies)    Medications: Outpatient Encounter Medications as of 04/30/2022  Medication Sig   Acetaminophen-Caffeine (EXCEDRIN TENSION HEADACHE) 500-65 MG TABS Take 3 tablets by mouth 2 (two) times daily as needed (headache).   azelastine (ASTELIN) 0.1 % nasal spray Place 1 spray into both nostrils 2 (two) times daily.   buPROPion (WELLBUTRIN XL) 150 MG 24 hr tablet Take 150 mg by mouth daily.   colestipol (COLESTID) 1 g tablet Take by mouth.   etonogestrel-ethinyl estradiol (NUVARING) 0.12-0.015 MG/24HR vaginal ring Insert vaginally and leave in place for 3 consecutive weeks, then remove for 1 week.   fluticasone (FLONASE) 50 MCG/ACT nasal spray Place 2 sprays into both nostrils daily as needed for allergies or rhinitis.   gabapentin (NEURONTIN) 300 MG capsule Take 300 mg by mouth 2 (two) times daily.   ibuprofen (ADVIL) 800 MG tablet Take 1 tablet (800 mg total) by mouth every 8 (eight) hours as needed for mild pain or moderate pain.   loratadine (CLARITIN) 10 MG tablet Take 10 mg by mouth daily.   melatonin 5 MG TABS Take 5 mg by mouth at bedtime.    meloxicam (MOBIC) 15 MG tablet Take 15 mg by mouth daily.   montelukast (SINGULAIR) 10 MG tablet TAKE 1 TABLET(10 MG) BY MOUTH AT BEDTIME   NORTRIPTYLINE HCL PO Take 20 mg by mouth at bedtime.  spironolactone (ALDACTONE) 100 MG tablet TAKE 2 TABLETS BY MOUTH DAILY   No facility-administered encounter medications on file as of 04/30/2022.    Social History: Social History   Tobacco Use   Smoking status: Never   Smokeless tobacco: Never  Vaping Use   Vaping Use: Never used  Substance Use Topics   Alcohol use: No   Drug use: No    Family Medical History: Family History  Problem Relation Age of Onset   Hypertension Mother    Mental illness Maternal Grandmother    Hyperlipidemia Paternal Grandfather    Diabetes Paternal Grandfather    Breast  cancer Neg Hx    Ovarian cancer Neg Hx    Colon cancer Neg Hx     Physical Examination: Vitals:   04/30/22 1101  BP: 118/78    General: Patient is well developed, well nourished, calm, collected, and in no apparent distress. Attention to examination is appropriate.  Respiratory: Patient is breathing without any difficulty.   NEUROLOGICAL:     Awake, alert, oriented to person, place, and time.  Speech is clear and fluent. Fund of knowledge is appropriate.   Cranial Nerves: Pupils equal round and reactive to light.  Facial tone is symmetric.    No significant posterior cervical tenderness. Mild tenderness in bilateral trapezial region.   Reasonable ROM of lumbar spine with no pain No posterior lumbar tenderness.   No abnormal lesions on exposed skin.   Strength: Side Biceps Triceps Deltoid Interossei Grip Wrist Ext. Wrist Flex.  R 5 5 5 5 5 5 5   L 5 5 5 5 5 5 5    Side Iliopsoas Quads Hamstring PF DF EHL  R 5 5 5 5 5 5   L 5 5 5 5 5 5    Reflexes are 2+ and symmetric at the biceps, triceps, brachioradialis, patella and achilles.   Hoffman's is absent.  Clonus is not present.   Bilateral upper and lower extremity sensation is intact to light touch.     Gait is normal.     Medical Decision Making  Imaging: MRI of brain dated 12/26/21:  FINDINGS: Brain:   No age advanced or lobar predominant parenchymal atrophy.   No cortical encephalomalacia is identified. No significant cerebral white matter disease.   There is no acute infarct.   No evidence of an intracranial mass.   No chronic intracranial blood products.   No extra-axial fluid collection.   No midline shift.   Mega cisterna magna (anatomic variant).   No pathologic intracranial enhancement identified.   Vascular: Maintained flow voids within the proximal large arterial vessels. 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   Skull and upper cervical spine: No focal  suspicious marrow lesion.   Sinuses/Orbits: No mass or acute finding within the imaged orbits. No significant paranasal sinus disease.   IMPRESSION: No evidence of acute intracranial abnormality.   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   Otherwise unremarkable MRI appearance of the brain.     Electronically Signed   By: D.O.   On: 12/26/2021 17:40  MRI of cervical spine dated 03/25/22:    FINDINGS: Alignment: Straightening of the expected cervical lordosis. No significant spondylolisthesis.   Vertebrae: Vertebral body height is maintained. No significant marrow edema or focal suspicious osseous lesion.   Cord: No signal abnormality identified within the cervical spinal cord.   Posterior Fossa, vertebral arteries, paraspinal tissues: Retrocerebellar CSF intensity prominence at midline, measuring  14 mm in AP dimension. This may reflect a mega cisterna magna or posterior fossa arachnoid cyst. Flow voids preserved within the imaged cervical vertebral arteries. No paraspinal mass or collection.   Disc levels:   Mild multilevel disc degeneration.   C2-C3: No significant disc herniation or stenosis.   C3-C4: A small central disc protrusion focally effaces the ventral thecal sac, and contacts the ventral aspect of the spinal cord. No significant foraminal stenosis.   C4-C5: No significant disc herniation or stenosis.   C5-C6: No significant disc herniation or stenosis.   C6-C7: No significant disc herniation or stenosis.   C7-T1: No significant disc herniation or stenosis.   IMPRESSION: Mild cervical spondylosis, as outlined. Most notably at C3-C4, a small central disc protrusion focally effaces the ventral thecal sac and contacts the ventral aspect of the spinal cord.   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     Electronically Signed   By: Kellie Simmering D.O.   On: 03/25/2022  17:47        I have personally reviewed the images and agree with the above interpretation.  EMG of bilateral upper extremities dated 12/30/21 (per neurology notes):  Normal study. No electrodiagnostic evidence of large fiber neuropathy in the upper extremities.   Above cervical and brain MRIs reviewed with Dr. Izora Ribas prior to her visit.   Assessment and Plan: Ms. Supple is a pleasant 28 y.o. female has She has intermittent neck pain that is more of a muscular soreness. She has intermittent flare ups of constant pain and tingling in both arms (entire arm mostly into hands). She notes some weakness when she has a flare up. Pain in arms started last May with no known injury.   MRI of cervical spine shows small central disc at C3-C4 with no central or foraminal stenosis. She has mild cervical spondylosis.   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   MRI of cervical spine and brain reviewed with Dr. Izora Ribas prior to her visit. Her cyst is stable comparing the MRI brain and MRI C spine so it is unlikely to be causing symptoms. She does not need a follow up scan for this.  She has intermittent right leg pain (entire leg) with numbness and feeling of having a rubber band around her leg. No left leg symptoms. No weakness.   MRI of lumbar spine has been ordered by neurology.   Treatment options discussed with patient and following plan made:   - Agree with PT for cervical spine.  - Reviewed that cyst is not likely cause of her symptoms.  - She has brain MRI picture from 10 years ago. She will send to me on MyChart to review with Dr. Izora Ribas. Will send her a message once this is done.  - She has appointment to see thoracic outlet specialist at the end of the month. Will message her in [place] in March to check on her.  - We briefly discussed lumbar MRI that was ordered. This may be helpful in addressing right leg pain. She wants to see thoracic outlet  specialist prior to having this done.   I spent a total of 30 minutes in face-to-face and non-face-to-face activities related to this patient's care today including review of outside records, review of imaging, review of symptoms, physical exam, discussion of differential diagnosis, discussion of treatment options, and documentation.   Thank you for involving me in the care of this patient.   Marzetta Board  Sherice Ijames PA-C Dept. of Neurosurgery

## 2022-04-30 ENCOUNTER — Encounter: Payer: Self-pay | Admitting: Orthopedic Surgery

## 2022-04-30 ENCOUNTER — Ambulatory Visit: Payer: BC Managed Care – PPO | Admitting: Orthopedic Surgery

## 2022-04-30 VITALS — BP 118/78 | Ht 66.0 in | Wt 149.8 lb

## 2022-04-30 DIAGNOSIS — M47812 Spondylosis without myelopathy or radiculopathy, cervical region: Secondary | ICD-10-CM

## 2022-04-30 DIAGNOSIS — M79602 Pain in left arm: Secondary | ICD-10-CM | POA: Diagnosis not present

## 2022-04-30 DIAGNOSIS — M79604 Pain in right leg: Secondary | ICD-10-CM

## 2022-04-30 DIAGNOSIS — M79601 Pain in right arm: Secondary | ICD-10-CM

## 2022-05-01 ENCOUNTER — Encounter: Payer: Self-pay | Admitting: Orthopedic Surgery
